# Patient Record
Sex: Female | Born: 1990 | Race: White | Hispanic: No | Marital: Married | Smoking: Never smoker
Health system: Southern US, Community
[De-identification: ages and names within clinical notes are randomized; demographics above are authoritative.]

## PROBLEM LIST (undated history)

## (undated) DIAGNOSIS — I10 Essential (primary) hypertension: Secondary | ICD-10-CM

## (undated) HISTORY — PX: OTHER SURGICAL HISTORY: SHX169

## (undated) HISTORY — PX: CHOLECYSTECTOMY: SHX55

---

## 2010-05-11 ENCOUNTER — Ambulatory Visit: Payer: Self-pay | Admitting: Family Medicine

## 2011-03-05 ENCOUNTER — Observation Stay: Payer: Self-pay | Admitting: Obstetrics and Gynecology

## 2011-03-11 ENCOUNTER — Ambulatory Visit: Payer: Self-pay | Admitting: Obstetrics & Gynecology

## 2011-03-16 ENCOUNTER — Observation Stay: Payer: Self-pay

## 2011-04-02 ENCOUNTER — Observation Stay: Payer: Self-pay | Admitting: Obstetrics & Gynecology

## 2011-04-20 ENCOUNTER — Observation Stay: Payer: Self-pay

## 2011-05-13 ENCOUNTER — Emergency Department: Payer: Self-pay | Admitting: Emergency Medicine

## 2011-06-04 ENCOUNTER — Observation Stay: Payer: Self-pay

## 2011-06-27 ENCOUNTER — Observation Stay: Payer: Self-pay | Admitting: Obstetrics and Gynecology

## 2011-06-29 ENCOUNTER — Inpatient Hospital Stay: Payer: Self-pay

## 2011-07-19 ENCOUNTER — Ambulatory Visit: Payer: Self-pay | Admitting: Surgery

## 2011-07-25 ENCOUNTER — Ambulatory Visit: Payer: Self-pay | Admitting: Surgery

## 2011-07-27 LAB — PATHOLOGY REPORT

## 2012-05-15 ENCOUNTER — Emergency Department: Payer: Self-pay | Admitting: Emergency Medicine

## 2012-05-15 LAB — URINALYSIS, COMPLETE
Bacteria: NONE SEEN
Bilirubin,UR: NEGATIVE
Glucose,UR: NEGATIVE mg/dL (ref 0–75)
Ketone: NEGATIVE
Leukocyte Esterase: NEGATIVE
Nitrite: NEGATIVE
Specific Gravity: 1.027 (ref 1.003–1.030)
Squamous Epithelial: 2
WBC UR: 3 /HPF (ref 0–5)

## 2012-05-15 LAB — CBC
HCT: 35.9 % (ref 35.0–47.0)
MCHC: 33.6 g/dL (ref 32.0–36.0)
MCV: 83 fL (ref 80–100)
RBC: 4.34 10*6/uL (ref 3.80–5.20)
RDW: 14.5 % (ref 11.5–14.5)

## 2012-05-15 LAB — COMPREHENSIVE METABOLIC PANEL
Albumin: 3.8 g/dL (ref 3.4–5.0)
Anion Gap: 7 (ref 7–16)
Calcium, Total: 8.9 mg/dL (ref 8.5–10.1)
Co2: 26 mmol/L (ref 21–32)
Creatinine: 0.66 mg/dL (ref 0.60–1.30)
EGFR (Non-African Amer.): >60
Glucose: 93 mg/dL (ref 65–99)
Osmolality: 277 (ref 275–301)
SGOT(AST): 22 U/L (ref 15–37)
SGPT (ALT): 19 U/L (ref 12–78)
Total Protein: 7.4 g/dL (ref 6.4–8.2)

## 2012-05-17 ENCOUNTER — Other Ambulatory Visit: Payer: Self-pay | Admitting: Emergency Medicine

## 2012-05-17 LAB — HCG, QUANTITATIVE, PREGNANCY: Beta Hcg, Quant.: 203 m[IU]/mL — ABNORMAL HIGH

## 2015-11-13 ENCOUNTER — Ambulatory Visit
Admission: EM | Admit: 2015-11-13 | Discharge: 2015-11-13 | Disposition: A | Discharge status: Home / Self Care | Payer: Managed Care, Other (non HMO) | Attending: Family Medicine | Admitting: Family Medicine

## 2015-11-13 DIAGNOSIS — K529 Noninfective gastroenteritis and colitis, unspecified: Secondary | ICD-10-CM | POA: Diagnosis not present

## 2015-11-13 LAB — COMPREHENSIVE METABOLIC PANEL
ALT: 17 U/L (ref 14–54)
AST: 19 U/L (ref 15–41)
Albumin: 4.1 g/dL (ref 3.5–5.0)
Alkaline Phosphatase: 75 U/L (ref 38–126)
Anion gap: 3 — ABNORMAL LOW (ref 5–15)
BUN: 10 mg/dL (ref 6–20)
CO2: 26 mmol/L (ref 22–32)
Calcium: 8.9 mg/dL (ref 8.9–10.3)
Chloride: 107 mmol/L (ref 101–111)
Creatinine, Ser: 0.66 mg/dL (ref 0.44–1.00)
GFR calc Af Amer: >60 mL/min (ref >60)
GFR calc non Af Amer: >60 mL/min (ref >60)
Glucose, Bld: 97 mg/dL (ref 65–99)
Potassium: 3.8 mmol/L (ref 3.5–5.1)
Sodium: 136 mmol/L (ref 135–145)
Total Bilirubin: 0.6 mg/dL (ref 0.3–1.2)
Total Protein: 7.4 g/dL (ref 6.5–8.1)

## 2015-11-13 LAB — CBC WITH DIFFERENTIAL/PLATELET
Basophils Absolute: 0 10*3/uL (ref 0–0.1)
Basophils Relative: 0 %
Eosinophils Absolute: 0.1 10*3/uL (ref 0–0.7)
Eosinophils Relative: 2 %
HCT: 34.3 % — ABNORMAL LOW (ref 35.0–47.0)
Hemoglobin: 11.2 g/dL — ABNORMAL LOW (ref 12.0–16.0)
Lymphocytes Relative: 33 %
Lymphs Abs: 2.1 10*3/uL (ref 1.0–3.6)
MCH: 25 pg — ABNORMAL LOW (ref 26.0–34.0)
MCHC: 32.5 g/dL (ref 32.0–36.0)
MCV: 76.8 fL — ABNORMAL LOW (ref 80.0–100.0)
Monocytes Absolute: 0.3 10*3/uL (ref 0.2–0.9)
Monocytes Relative: 5 %
Neutro Abs: 3.8 10*3/uL (ref 1.4–6.5)
Neutrophils Relative %: 60 %
Platelets: 193 10*3/uL (ref 150–440)
RBC: 4.47 MIL/uL (ref 3.80–5.20)
RDW: 15.4 % — ABNORMAL HIGH (ref 11.5–14.5)
WBC: 6.3 10*3/uL (ref 3.6–11.0)

## 2015-11-13 LAB — URINALYSIS COMPLETE WITH MICROSCOPIC (ARMC ONLY)
Glucose, UA: NEGATIVE mg/dL
Leukocytes, UA: NEGATIVE
Nitrite: NEGATIVE
Protein, ur: 30 mg/dL — AB
Specific Gravity, Urine: 1.03 (ref 1.005–1.030)
pH: 5.5 (ref 5.0–8.0)

## 2015-11-13 MED ORDER — ONDANSETRON 8 MG PO TBDP
8.0000 mg | ORAL_TABLET | Freq: Once | ORAL | Status: AC
  Administered 2015-11-13: 8 mg via ORAL

## 2015-11-13 MED ORDER — ONDANSETRON 8 MG PO TBDP: 8.0000 mg | ORAL_TABLET | Freq: Two times a day (BID) | ORAL | Status: DC

## 2015-11-13 NOTE — Discharge Instructions (Signed)

## 2015-11-13 NOTE — ED Notes (Signed)
Patient c/o vomiting, diarrhea, fever, body aches, nausea, chills, sweating, and inability to urinate since yesterday morning.

## 2015-11-13 NOTE — ED Provider Notes (Signed)
CSN: [id number]     Arrival date & time 11/13/15  1191 History   First MD Initiated Contact with Patient 11/13/15 1205     Chief Complaint  Patient presents with  . Emesis  . Diarrhea  . Fever  . Generalized Body Aches   (Consider location/radiation/quality/duration/timing/severity/associated sxs/prior Treatment) HPI   This is a 25 year old female presents with a three-day history of vomiting diarrhea and fever. She denies any belly pain. He states her fever was 103.8 last night. She is afebrile but has been taking some Tylenol according to her. She states that she has been unable to urinate since yesterday morning. She has very limited intake of food or fluids because of the vomiting that occurs immediately afterwards. She said 5-6 BMs per day and watery and have no blood or mucus content. She states that no one knows the family has been sick. She has not been eating outside of the home.  History reviewed. No pertinent past medical history. Past Surgical History  Procedure Laterality Date  . Cholecystectomy    . Tubes tied     Family History  Problem Relation Age of Onset  . Hypertension Father    Social History  Substance Use Topics  . Smoking status: Never Smoker   . Smokeless tobacco: Never Used  . Alcohol Use: No   OB History    No data available     Review of Systems  Constitutional: Positive for fever, chills, activity change and appetite change.  Gastrointestinal: Positive for nausea, vomiting and diarrhea. Negative for abdominal pain, blood in stool and abdominal distention.  Genitourinary: Positive for difficulty urinating.  All other systems reviewed and are negative.   Allergies  Latex  Home Medications   Prior to Admission medications   Medication Sig Start Date End Date Taking? Authorizing Provider  ondansetron (ZOFRAN ODT) 8 MG disintegrating tablet Take 1 tablet (8 mg total) by mouth 2 (two) times daily. 11/13/15   Lutricia Feil, PA-C   Meds  Ordered and Administered this Visit   Medications  ondansetron (ZOFRAN-ODT) disintegrating tablet 8 mg (8 mg Oral Given 11/13/15 1138)    BP 134/75 mmHg  Pulse 78  Temp(Src) 98.1 F (36.7 C) (Oral)  Resp 18  Ht  (1.651 m)  Wt 240 lb (108.863 kg)  BMI 39.94 kg/m2  SpO2 100%  LMP 11/03/2015 No data found.   Physical Exam  ED Course  Procedures (including critical care time)  Labs Review Labs Reviewed  CBC WITH DIFFERENTIAL/PLATELET - Abnormal; Notable for the following:    Hemoglobin 11.2 (*)    HCT 34.3 (*)    MCV 76.8 (*)    MCH 25.0 (*)    RDW 15.4 (*)    All other components within normal limits  URINALYSIS COMPLETEWITH MICROSCOPIC ([facility] ONLY) - Abnormal; Notable for the following:    APPearance CLOUDY (*)    Bilirubin Urine 1+ (*)    Ketones, ur TRACE (*)    Hgb urine dipstick 3+ (*)    Protein, ur 30 (*)    Bacteria, UA RARE (*)    Squamous Epithelial / LPF 0-5 (*)    All other components within normal limits  COMPREHENSIVE METABOLIC PANEL - Abnormal; Notable for the following:    Anion gap 3 (*)    All other components within normal limits  URINE CULTURE    Imaging Review No results found.   Visual Acuity Review  Right Eye Distance:   Left Eye Distance:  Bilateral Distance:    Right Eye Near:   Left Eye Near:    Bilateral Near:         MDM   1. Gastroenteritis, acute    There are no discharge medications for this patient.  Plan: 1. Test/x-ray results and diagnosis reviewed with patient 2. rx as per orders; risks, benefits, potential side effects reviewed with patient 3. Recommend supportive treatment with increase fluids. I told one important to take in fluids and to eat but when she begins to eat she should consider a brat diet and advance as tolerated. She needs to be seen by her primary for her increased bleeding from her menstrual cycle which has lasted 2 weeks now. This probably accounts for her increased  RBCs in her  urinalysis. 4. F/u prn if symptoms worsen or don't improve     Lutricia Feil, PA-C 11/13/15 1345

## 2015-11-15 LAB — URINE CULTURE: Culture: 50000

## 2017-05-30 ENCOUNTER — Encounter: Payer: Self-pay | Admitting: Emergency Medicine

## 2017-05-30 ENCOUNTER — Emergency Department
Admission: EM | Admit: 2017-05-30 | Discharge: 2017-05-30 | Disposition: A | Discharge status: Home / Self Care | Payer: Self-pay | Attending: Emergency Medicine | Admitting: Emergency Medicine

## 2017-05-30 ENCOUNTER — Emergency Department: Payer: Self-pay

## 2017-05-30 DIAGNOSIS — Y929 Unspecified place or not applicable: Secondary | ICD-10-CM | POA: Insufficient documentation

## 2017-05-30 DIAGNOSIS — Y999 Unspecified external cause status: Secondary | ICD-10-CM | POA: Insufficient documentation

## 2017-05-30 DIAGNOSIS — S60212A Contusion of left wrist, initial encounter: Secondary | ICD-10-CM

## 2017-05-30 DIAGNOSIS — W208XXA Other cause of strike by thrown, projected or falling object, initial encounter: Secondary | ICD-10-CM | POA: Insufficient documentation

## 2017-05-30 DIAGNOSIS — Y9389 Activity, other specified: Secondary | ICD-10-CM | POA: Insufficient documentation

## 2017-05-30 DIAGNOSIS — S63502A Unspecified sprain of left wrist, initial encounter: Secondary | ICD-10-CM

## 2017-05-30 NOTE — ED Notes (Signed)
Patient states she was working on a car Sunday and a window fell on her left wrist.  No obvious deformity.  Ice pack given to patient.

## 2017-05-30 NOTE — ED Provider Notes (Signed)
[facility] Emergency Department Provider Note ____________________________________________  Time seen: 1026  I have reviewed the triage vital signs and the nursing notes.  HISTORY  Chief Complaint  Wrist Pain  HPI Ann Huber is a 26 y.o. female presents to the ED for evaluation of pain to the dorsal left wrist and hand, NUMBER car on Sunday. Patient describes that she is working on the car door latch, when the window panel apparently fell trapping her hand in the window frame. She denies any cut laceration, or broken glass. She presents today with increasing pain grossly dorsal hand or wrist that is increased with extension of the hand. She denies anydistal paresthesias, wrist drop, or bruising. She's been applying ice and taking appropriate for pain relief. She gives a remote history (3 months) of a triquetrum chip fracture of the same wrist, recently cleared by [facility] ortho.   History reviewed. No pertinent past medical history.  There are no active problems to display for this patient.   Past Surgical History:  Procedure Laterality Date  . CHOLECYSTECTOMY    . Tubes Tied      Prior to Admission medications   Medication Sig Start Date End Date Taking? Authorizing Provider  ondansetron (ZOFRAN ODT) 8 MG disintegrating tablet Take 1 tablet (8 mg total) by mouth 2 (two) times daily. 11/13/15   Lutricia Feil, PA-C    Allergies Latex  Family History  Problem Relation Age of Onset  . Hypertension Father     Social History Social History  Substance Use Topics  . Smoking status: Never Smoker  . Smokeless tobacco: Never Used  . Alcohol use No    Review of Systems  Constitutional: Negative for fever. Musculoskeletal: Negative for back pain. Left wrist pain as above Skin: Negative for rash. Neurological: Negative for headaches, focal weakness or numbness. ____________________________________________  PHYSICAL EXAM:  VITAL SIGNS: ED  Triage Vitals  Enc Vitals Group     BP 05/30/17 0955 137/75     Pulse Rate 05/30/17 0955 88     Resp 05/30/17 0955 18     Temp 05/30/17 0955 98.8 F (37.1 C)     Temp Source 05/30/17 0955 Oral     SpO2 05/30/17 0955 99 %     Weight 05/30/17 0956 240 lb (108.9 kg)     Height 05/30/17 0956 5\' 5"  (1.651 m)     Head Circumference --      Peak Flow --      Pain Score 05/30/17 1149 3     Pain Loc --      Pain Edu? --      Excl. in GC? --     Constitutional: Alert and oriented. Well appearing and in no distress. Head: Normocephalic and atraumatic. Cardiovascular: Normal distal pulses. Respiratory: Normal respiratory effort. Musculoskeletal: Left wrist without obvious deformity, dislocation, or edema. Minimally tender to palp over the dorsal hand and radial wrist. Normal composite fist. Nontender with normal range of motion in all other extremities.  Neurologic:  Normal gross sensation. Normal intrinsic & opposition testing. Normal speech and language. No gross focal neurologic deficits are appreciated. Skin:  Skin is warm, dry and intact. No rash noted. ___________________________________________   RADIOLOGY  Left Wrist  IMPRESSION: Negative.  I, Rox Mcgriff, Charlesetta Ivory, personally viewed and evaluated these images (plain radiographs) as part of my medical decision making, as well as reviewing the written report by the radiologist. ____________________________________________  PROCEDURES  Wrist cock-up splint ____________________________________________  INITIAL  IMPRESSION / ASSESSMENT AND PLAN / ED COURSE  Patient ED evaluation of a left wrist contusion. No radiologic evidence of acute fracture or dislocation. The patient is placed in a wrist cock-up splint for support and comfort. She should dosed ibuprofen over-the-counter and consider follow-up at [facility] orthopedics for ongoing symptom management. ____________________________________________  FINAL CLINICAL IMPRESSION(S) /  ED DIAGNOSES  Final diagnoses:  Contusion of left wrist, initial encounter  Sprain of left wrist, initial encounter     Lissa Hoard, PA-C 05/30/17 1819    Jene Every, MD 06/02/17 [phone]

## 2017-05-30 NOTE — Discharge Instructions (Signed)
Your exam and x-ray are consistent with a wrist contusion and inflammation of the extensor tendons. Wear the splint for support. Take Naproxen daily. Apply ice to reduce pain and swelling. Follow-up with [facility] ortho for continued symptoms.

## 2017-05-30 NOTE — ED Triage Notes (Addendum)
states she was working on car  And the window came down on her left hand /wrist  States pain radiates from back of hand into wrist area

## 2017-08-14 ENCOUNTER — Encounter: Payer: Self-pay | Admitting: Intensive Care

## 2017-08-14 ENCOUNTER — Emergency Department
Admission: EM | Admit: 2017-08-14 | Discharge: 2017-08-14 | Disposition: A | Discharge status: Home / Self Care | Payer: Self-pay | Attending: Emergency Medicine | Admitting: Emergency Medicine

## 2017-08-14 ENCOUNTER — Emergency Department: Payer: Self-pay

## 2017-08-14 DIAGNOSIS — R739 Hyperglycemia, unspecified: Secondary | ICD-10-CM | POA: Insufficient documentation

## 2017-08-14 DIAGNOSIS — Z91040 Latex allergy status: Secondary | ICD-10-CM | POA: Insufficient documentation

## 2017-08-14 DIAGNOSIS — E875 Hyperkalemia: Secondary | ICD-10-CM | POA: Insufficient documentation

## 2017-08-14 DIAGNOSIS — I10 Essential (primary) hypertension: Secondary | ICD-10-CM | POA: Insufficient documentation

## 2017-08-14 DIAGNOSIS — R000 Tachycardia, unspecified: Secondary | ICD-10-CM | POA: Insufficient documentation

## 2017-08-14 DIAGNOSIS — R55 Syncope and collapse: Secondary | ICD-10-CM

## 2017-08-14 DIAGNOSIS — N3000 Acute cystitis without hematuria: Secondary | ICD-10-CM | POA: Insufficient documentation

## 2017-08-14 DIAGNOSIS — Z9049 Acquired absence of other specified parts of digestive tract: Secondary | ICD-10-CM | POA: Insufficient documentation

## 2017-08-14 LAB — URINALYSIS, COMPLETE (UACMP) WITH MICROSCOPIC
BILIRUBIN URINE: NEGATIVE
Glucose, UA: NEGATIVE mg/dL
HGB URINE DIPSTICK: NEGATIVE
KETONES UR: NEGATIVE mg/dL
Nitrite: POSITIVE — AB
Protein, ur: 100 mg/dL — AB
Specific Gravity, Urine: 1.027 (ref 1.005–1.030)
pH: 6 (ref 5.0–8.0)

## 2017-08-14 LAB — BASIC METABOLIC PANEL
Anion gap: 8 (ref 5–15)
BUN: 7 mg/dL (ref 6–20)
CALCIUM: 9.2 mg/dL (ref 8.9–10.3)
CO2: 22 mmol/L (ref 22–32)
CREATININE: 0.9 mg/dL (ref 0.44–1.00)
Chloride: 105 mmol/L (ref 101–111)
GFR calc non Af Amer: >60 mL/min (ref >60)
Glucose, Bld: 161 mg/dL — ABNORMAL HIGH (ref 65–99)
Potassium: 3.4 mmol/L — ABNORMAL LOW (ref 3.5–5.1)
SODIUM: 135 mmol/L (ref 135–145)

## 2017-08-14 LAB — CBC
HCT: 34.3 % — ABNORMAL LOW (ref 35.0–47.0)
Hemoglobin: 11.1 g/dL — ABNORMAL LOW (ref 12.0–16.0)
MCH: 24.5 pg — AB (ref 26.0–34.0)
MCHC: 32.5 g/dL (ref 32.0–36.0)
MCV: 75.5 fL — AB (ref 80.0–100.0)
PLATELETS: 218 10*3/uL (ref 150–440)
RBC: 4.54 MIL/uL (ref 3.80–5.20)
RDW: 15.5 % — ABNORMAL HIGH (ref 11.5–14.5)
WBC: 8.4 10*3/uL (ref 3.6–11.0)

## 2017-08-14 LAB — POCT PREGNANCY, URINE: PREG TEST UR: NEGATIVE

## 2017-08-14 LAB — TROPONIN I: Troponin I: <0.03 ng/mL (ref <0.03)

## 2017-08-14 MED ORDER — SODIUM CHLORIDE 0.9 % IV BOLUS (SEPSIS)
1000.0000 mL | Freq: Once | INTRAVENOUS | Status: AC
  Administered 2017-08-14: 1000 mL via INTRAVENOUS

## 2017-08-14 MED ORDER — SULFAMETHOXAZOLE-TRIMETHOPRIM 800-160 MG PO TABS: 1.0000 | ORAL_TABLET | Freq: Two times a day (BID) | ORAL | 0 refills | Status: DC

## 2017-08-14 MED ORDER — CEFTRIAXONE SODIUM IN DEXTROSE 20 MG/ML IV SOLN
1.0000 g | Freq: Once | INTRAVENOUS | Status: DC
  Filled 2017-08-14: qty 50

## 2017-08-14 MED ORDER — CEFTRIAXONE SODIUM 1 G IJ SOLR
1.0000 g | Freq: Once | INTRAMUSCULAR | Status: AC
  Administered 2017-08-14: 1 g via INTRAVENOUS
  Filled 2017-08-14: qty 10

## 2017-08-14 NOTE — ED Triage Notes (Signed)
Patient reports she was standing up at work started feeling lightheaded, shaking, and started seeing white space around her and she felt as if she was going to pass out. Did not have a LOC or fall to ground. No injury. Denies pain. Ambulatory in triage with no problems

## 2017-08-14 NOTE — ED Notes (Signed)
Pt denies pain, pt had a syncopal episode at work, pt reports " I feel better", pt denies any symptoms at time of assessment

## 2017-08-14 NOTE — ED Provider Notes (Signed)
[facility] Emergency Department Provider Note  ____________________________________________  Time seen: Approximately 4:46 PM  I have reviewed the triage vital signs and the nursing notes.   HISTORY  Chief Complaint Near Syncope    HPI Ann Huber is a 26 y.o. female, nonpregnant, with a history of recurrent UTI, presenting with presyncope.  The patient reports that she was at work today when she began to have a lightheaded sensation with mild palpitations.  She had no associated chest pain, shortness of breath, and did not have a syncopal event.  She has had dysuria with urinary frequency for the past 2 days.  No abdominal pain, nausea vomiting or diarrhea, fever or chills, change in vaginal discharge.  EMS was called with blood pressure 148/80 and a blood sugar of 177.  History reviewed. No pertinent past medical history.  There are no active problems to display for this patient.   Past Surgical History:  Procedure Laterality Date  . CHOLECYSTECTOMY    . Tubes Tied      Current Outpatient Rx  . Order #: [id number] Class: Normal  . Order #: [id number] Class: Print    Allergies Latex  Family History  Problem Relation Age of Onset  . Hypertension Father     Social History Social History   Tobacco Use  . Smoking status: Never Smoker  . Smokeless tobacco: Never Used  Substance Use Topics  . Alcohol use: No  . Drug use: No    Review of Systems Constitutional: No fever/chills.  P.  No diaphoresis. Eyes: No visual changes. ENT: No sore throat. No congestion or rhinorrhea. Cardiovascular: Denies chest pain. Denies palpitations. Respiratory: Denies shortness of breath.  No cough. Gastrointestinal: No abdominal pain.  No nausea, no vomiting.  No diarrhea.  No constipation. Genitourinary: Positive for dysuria.  Positive for urinary frequency. Musculoskeletal: Negative for back pain. Skin: Negative for rash. Neurological: Negative for  headaches. No focal numbness, tingling or weakness.     ____________________________________________   PHYSICAL EXAM:  VITAL SIGNS: ED Triage Vitals [08/14/17 1448]  Enc Vitals Group     BP (!) 155/96     Pulse Rate (!) 125     Resp 18     Temp 98.5 F (36.9 C)     Temp Source Oral     SpO2 99 %     Weight 240 lb (108.9 kg)     Height 5\' 5"  (1.651 m)     Head Circumference      Peak Flow      Pain Score      Pain Loc      Pain Edu?      Excl. in GC?     Constitutional: Alert and oriented. Well appearing and in no acute distress. Answers questions appropriately. Eyes: Conjunctivae are normal.  EOMI. No scleral icterus. Head: Atraumatic. Nose: No congestion/rhinnorhea. Mouth/Throat: Mucous membranes are dry.  Neck: No stridor.  Supple.  No JVD.  No meningismus. Cardiovascular: Fast rate, regular rhythm. No murmurs, rubs or gallops.  Respiratory: Normal respiratory effort.  No accessory muscle use or retractions. Lungs CTAB.  No wheezes, rales or ronchi. Gastrointestinal: Soft, nontender and nondistended.  No guarding or rebound.  No peritoneal signs. GU: Deferred as the patient has no vaginal complaints. Musculoskeletal: No LE edema. No ttp in the calves or palpable cords.  Negative Homan's sign. Neurologic:  A&Ox3.  Speech is clear.  Face and smile are symmetric.  EOMI.  Moves all extremities well. Skin:  Skin is warm, dry and intact. No rash noted. Psychiatric: Mood and affect are normal. Speech and behavior are normal.  Normal judgement.  ____________________________________________   LABS (all labs ordered are listed, but only abnormal results are displayed)  Labs Reviewed  BASIC METABOLIC PANEL - Abnormal; Notable for the following components:      Result Value   Potassium 3.4 (*)    Glucose, Bld 161 (*)    All other components within normal limits  CBC - Abnormal; Notable for the following components:   Hemoglobin 11.1 (*)    HCT 34.3 (*)    MCV 75.5 (*)     MCH 24.5 (*)    RDW 15.5 (*)    All other components within normal limits  URINALYSIS, COMPLETE (UACMP) WITH MICROSCOPIC - Abnormal; Notable for the following components:   Color, Urine AMBER (*)    APPearance CLEAR (*)    Protein, ur 100 (*)    Nitrite POSITIVE (*)    Leukocytes, UA TRACE (*)    Bacteria, UA RARE (*)    Squamous Epithelial / LPF 0-5 (*)    All other components within normal limits  URINE CULTURE  TROPONIN I  POCT PREGNANCY, URINE  POC URINE PREG, ED   ____________________________________________  EKG  ED ECG REPORT I, Rockne MenghiniNorman, Anne-Caroline, the attending physician, personally viewed and interpreted this ECG.   Date: 08/14/2017  EKG Time: 1454  Rate: 123  Rhythm: sinus tachycardia  Axis: normal  Intervals:none  ST&T Change: No STEMI  ____________________________________________  RADIOLOGY  Dg Chest 2 View  Result Date: 08/14/2017 CLINICAL DATA:  Syncope. EXAM: CHEST  2 VIEW COMPARISON:  None. FINDINGS: The heart size and mediastinal contours are within normal limits. Both lungs are clear. The visualized skeletal structures are unremarkable. IMPRESSION: No active cardiopulmonary disease. Electronically Signed   By: Signa Kellaylor  Stroud M.D.   On: 08/14/2017 15:33    ____________________________________________   PROCEDURES  Procedure(s) performed: None  Procedures  Critical Care performed: No ____________________________________________   INITIAL IMPRESSION / ASSESSMENT AND PLAN / ED COURSE  Pertinent labs & imaging results that were available during my care of the patient were reviewed by me and considered in my medical decision making (see chart for details).  26 y.o. email, nonpregnant, presenting with dysuria, urinary frequency and a presyncopal episode today.  Overall, the patient is tachycardic, and may be slightly hypovolemic given her dry mucous membranes.  He does have a urinary tract infection, but a reassuring creatinine and a normal  white blood cell count.  I will plan to treat her with intravenous fluids and Rocephin, and her urine has been sent for culture.  Her presyncope workup is likely due to hypovolemia or infection, rather than an acute cardiac or pulmonary cause.  Her EKG does not show ischemic changes, her chest x-ray shows no active cardiopulmonary disease, and her physical examination is reassuring.  Plan reevaluation after fluids and antibiotics, with anticipation for discharge home.  ----------------------------------------- 6:43 PM on 08/14/2017 -----------------------------------------  At this time, the patient's tachycardia has resolved, and she is able to tolerate liquid without any difficulty.  She is able to stand without becoming lightheaded.  She has received a dose of intravenous Rocephin, and will start Bactrim tomorrow for 7-day course.  She understands return precautions as well as follow-up instructions. ____________________________________________  FINAL CLINICAL IMPRESSION(S) / ED DIAGNOSES  Final diagnoses:  Acute cystitis without hematuria  Pre-syncope  Sinus tachycardia  Hyperkalemia  Hyperglycemia  Hypertension, unspecified type  NEW MEDICATIONS STARTED DURING THIS VISIT:  This SmartLink is deprecated. Use AVSMEDLIST instead to display the medication list for a patient.    Rockne Menghini, MD 08/14/17 [phone]

## 2017-08-14 NOTE — Discharge Instructions (Signed)
Please drink plenty of fluids to stay well-hydrated and to prevent lightheadedness.  Take the entire course of antibiotics, even if you are feeling better.  Please make an appointment with your primary care doctor to have your blood pressure rechecked, your blood sugar and your potassium rechecked, and to be reevaluated for your symptoms.  Return to the emergency department if you develop severe pain, lightheadedness or fainting, fever, or any other symptoms concerning to you.

## 2017-08-16 LAB — URINE CULTURE

## 2017-09-01 ENCOUNTER — Emergency Department
Admission: EM | Admit: 2017-09-01 | Discharge: 2017-09-01 | Disposition: A | Discharge status: Home / Self Care | Payer: Self-pay | Attending: Emergency Medicine | Admitting: Emergency Medicine

## 2017-09-01 ENCOUNTER — Other Ambulatory Visit: Payer: Self-pay

## 2017-09-01 DIAGNOSIS — R112 Nausea with vomiting, unspecified: Secondary | ICD-10-CM | POA: Insufficient documentation

## 2017-09-01 DIAGNOSIS — R1031 Right lower quadrant pain: Secondary | ICD-10-CM | POA: Insufficient documentation

## 2017-09-01 DIAGNOSIS — R1110 Vomiting, unspecified: Secondary | ICD-10-CM

## 2017-09-01 DIAGNOSIS — Z79899 Other long term (current) drug therapy: Secondary | ICD-10-CM | POA: Insufficient documentation

## 2017-09-01 DIAGNOSIS — R109 Unspecified abdominal pain: Secondary | ICD-10-CM

## 2017-09-01 DIAGNOSIS — Z91040 Latex allergy status: Secondary | ICD-10-CM | POA: Insufficient documentation

## 2017-09-01 DIAGNOSIS — R197 Diarrhea, unspecified: Secondary | ICD-10-CM | POA: Insufficient documentation

## 2017-09-01 LAB — CBC
HEMATOCRIT: 31.3 % — AB (ref 35.0–47.0)
HEMOGLOBIN: 10.1 g/dL — AB (ref 12.0–16.0)
MCH: 24.1 pg — AB (ref 26.0–34.0)
MCHC: 32.2 g/dL (ref 32.0–36.0)
MCV: 74.8 fL — ABNORMAL LOW (ref 80.0–100.0)
Platelets: 217 10*3/uL (ref 150–440)
RBC: 4.18 MIL/uL (ref 3.80–5.20)
RDW: 15.3 % — ABNORMAL HIGH (ref 11.5–14.5)
WBC: 6 10*3/uL (ref 3.6–11.0)

## 2017-09-01 LAB — URINALYSIS, COMPLETE (UACMP) WITH MICROSCOPIC
BACTERIA UA: NONE SEEN
BILIRUBIN URINE: NEGATIVE
GLUCOSE, UA: NEGATIVE mg/dL
Hgb urine dipstick: NEGATIVE
KETONES UR: NEGATIVE mg/dL
NITRITE: NEGATIVE
PROTEIN: 30 mg/dL — AB
Specific Gravity, Urine: 1.027 (ref 1.005–1.030)
pH: 5 (ref 5.0–8.0)

## 2017-09-01 LAB — COMPREHENSIVE METABOLIC PANEL
ALBUMIN: 3.8 g/dL (ref 3.5–5.0)
ALT: 14 U/L (ref 14–54)
ANION GAP: 7 (ref 5–15)
AST: 16 U/L (ref 15–41)
Alkaline Phosphatase: 71 U/L (ref 38–126)
BUN: 10 mg/dL (ref 6–20)
CO2: 25 mmol/L (ref 22–32)
Calcium: 8.9 mg/dL (ref 8.9–10.3)
Chloride: 106 mmol/L (ref 101–111)
Creatinine, Ser: 0.76 mg/dL (ref 0.44–1.00)
GFR calc Af Amer: >60 mL/min (ref >60)
GFR calc non Af Amer: >60 mL/min (ref >60)
GLUCOSE: 106 mg/dL — AB (ref 65–99)
POTASSIUM: 3.1 mmol/L — AB (ref 3.5–5.1)
SODIUM: 138 mmol/L (ref 135–145)
TOTAL PROTEIN: 6.8 g/dL (ref 6.5–8.1)
Total Bilirubin: 0.6 mg/dL (ref 0.3–1.2)

## 2017-09-01 LAB — LIPASE, BLOOD: LIPASE: 28 U/L (ref 11–51)

## 2017-09-01 LAB — POCT PREGNANCY, URINE: Preg Test, Ur: NEGATIVE

## 2017-09-01 MED ORDER — POTASSIUM CHLORIDE CRYS ER 20 MEQ PO TBCR
40.0000 meq | EXTENDED_RELEASE_TABLET | Freq: Once | ORAL | Status: AC
Start: 2017-09-01 — End: 2017-09-01
  Administered 2017-09-01: 40 meq via ORAL
  Filled 2017-09-01: qty 2

## 2017-09-01 MED ORDER — ONDANSETRON HCL 4 MG/2ML IJ SOLN
4.0000 mg | Freq: Once | INTRAMUSCULAR | Status: AC | PRN
  Administered 2017-09-01: 4 mg via INTRAVENOUS
  Filled 2017-09-01: qty 2

## 2017-09-01 MED ORDER — ONDANSETRON HCL 4 MG/2ML IJ SOLN
4.0000 mg | Freq: Once | INTRAMUSCULAR | Status: AC
  Administered 2017-09-01: 4 mg via INTRAVENOUS
  Filled 2017-09-01: qty 2

## 2017-09-01 MED ORDER — MORPHINE SULFATE (PF) 4 MG/ML IV SOLN
4.0000 mg | Freq: Once | INTRAVENOUS | Status: AC
  Administered 2017-09-01: 4 mg via INTRAVENOUS
  Filled 2017-09-01: qty 1

## 2017-09-01 MED ORDER — SODIUM CHLORIDE 0.9 % IV BOLUS (SEPSIS)
1000.0000 mL | Freq: Once | INTRAVENOUS | Status: AC
  Administered 2017-09-01: 1000 mL via INTRAVENOUS

## 2017-09-01 NOTE — ED Triage Notes (Signed)
Pt c/o RLQ pain with N/V/D since Tuesday. Pt is ambulatory, in NAD on arrival.

## 2017-09-01 NOTE — Discharge Instructions (Signed)
You are evaluated for nausea vomiting and diarrhea as well as right-sided abdominal pain, and although no certain cause was found, your exam and evaluation are overall reassuring in the emergency department today.  Return to the emergency department for any worsening or uncontrolled pain, fever, black or bloody stool, vomiting blood, vaginal discharge, pelvic pain, or any other symptoms concerning to you.  You may try over the counter ibuprofen and/or tylenol, use as directed on labeling.

## 2017-09-01 NOTE — ED Provider Notes (Signed)
[facility] Emergency Department Provider Note ____________________________________________   I have reviewed the triage vital signs and the triage nursing note.  HISTORY  Chief Complaint Abdominal Pain   Historian Patient  HPI Ann Huber is a 26 y.o. female with a history of cholecystectomy as well as BTL presenting for several days of abdominal cramping especially on the right side, at this point seems fairly constant for the last 2 or 3 days of right-sided mid abdomen down into the right lower quadrant.  She had nausea vomiting and diarrhea.  No black or bloody stools.  No fever.  No bloody emesis.  Never had pain like this before.  Pain is currently around 7 or 8 out of 10.  Nothing seems to make it worse or better.  No coughing or trouble breathing.  Denies unusual vaginal bleeding, last menstrual period was last week.  No vaginal discharge.  No pelvic pain.   History reviewed. No pertinent past medical history.  There are no active problems to display for this patient.   Past Surgical History:  Procedure Laterality Date  . CHOLECYSTECTOMY    . Tubes Tied      Prior to Admission medications   Medication Sig Start Date End Date Taking? Authorizing Provider  buPROPion (WELLBUTRIN SR) 150 MG 12 hr tablet Take 150 mg by mouth daily. 11/24/16 11/24/17 Yes [provider]  sertraline (ZOLOFT) 100 MG tablet Take 100 mg by mouth daily. 11/24/16  Yes [provider]  ondansetron (ZOFRAN ODT) 8 MG disintegrating tablet Take 1 tablet (8 mg total) by mouth 2 (two) times daily. Patient not taking: Reported on 09/01/2017 11/13/15   Ovid Curdoemer, William P, PA-C  sulfamethoxazole-trimethoprim (BACTRIM DS,SEPTRA DS) 800-160 MG tablet Take 1 tablet 2 (two) times daily by mouth. Patient not taking: Reported on 09/01/2017 08/14/17   Rockne MenghiniNorman, Anne-Caroline, MD    Allergies  Allergen Reactions  . Latex Hives and Swelling    Family History   Problem Relation Age of Onset  . Hypertension Father     Social History Social History   Tobacco Use  . Smoking status: Never Smoker  . Smokeless tobacco: Never Used  Substance Use Topics  . Alcohol use: No  . Drug use: No    Review of Systems  Constitutional: Negative for fever. Eyes: Negative for visual changes. ENT: Negative for sore throat. Cardiovascular: Negative for chest pain. Respiratory: Negative for shortness of breath. Gastrointestinal: Positive as per HPI Genitourinary: Negative for dysuria. Musculoskeletal: Negative for back pain. Skin: Negative for rash. Neurological: Negative for headache.  ____________________________________________   PHYSICAL EXAM:  VITAL SIGNS: ED Triage Vitals  Enc Vitals Group     BP 09/01/17 0727 133/80     Pulse Rate 09/01/17 0727 85     Resp 09/01/17 0727 17     Temp 09/01/17 0727 98.2 F (36.8 C)     Temp Source 09/01/17 0727 Oral     SpO2 09/01/17 0727 100 %     Weight 09/01/17 0727 240 lb (108.9 kg)     Height 09/01/17 0727 5\' 5"  (1.651 m)     Head Circumference --      Peak Flow --      Pain Score 09/01/17 0726 8     Pain Loc --      Pain Edu? --      Excl. in GC? --      Constitutional: Alert and oriented. Well appearing and in no distress. HEENT   Head:  Normocephalic and atraumatic.      Eyes: Conjunctivae are normal. Pupils equal and round.       Ears:         Nose: No congestion/rhinnorhea.   Mouth/Throat: Mucous membranes are moist.   Neck: No stridor. Cardiovascular/Chest: Normal rate, regular rhythm.  No murmurs, rubs, or gallops. Respiratory: Normal respiratory effort without tachypnea nor retractions. Breath sounds are clear and equal bilaterally. No wheezes/rales/rhonchi. Gastrointestinal: Soft. No distention, no guarding, no rebound.  Moderate right-sided tenderness to palpation without any masses, more so in the right mid abdomen but also somewhat in the right lower quadrant.  Mild  epigastric discomfort.  No left-sided pain or discomfort in the left upper or left lower quadrants.  Obese Genitourinary/rectal:Deferred Musculoskeletal: Nontender with normal range of motion in all extremities. No joint effusions.  No lower extremity tenderness.  No edema. Neurologic:  Normal speech and language. No gross or focal neurologic deficits are appreciated. Skin:  Skin is warm, dry and intact. No rash noted. Psychiatric: Mood and affect are normal. Speech and behavior are normal. Patient exhibits appropriate insight and judgment.   ____________________________________________  LABS (pertinent positives/negatives) I, Governor Rooks, MD the attending physician have reviewed the labs noted below.  Labs Reviewed  COMPREHENSIVE METABOLIC PANEL - Abnormal; Notable for the following components:      Result Value   Potassium 3.1 (*)    Glucose, Bld 106 (*)    All other components within normal limits  CBC - Abnormal; Notable for the following components:   Hemoglobin 10.1 (*)    HCT 31.3 (*)    MCV 74.8 (*)    MCH 24.1 (*)    RDW 15.3 (*)    All other components within normal limits  URINALYSIS, COMPLETE (UACMP) WITH MICROSCOPIC - Abnormal; Notable for the following components:   Color, Urine YELLOW (*)    APPearance HAZY (*)    Protein, ur 30 (*)    Leukocytes, UA MODERATE (*)    Squamous Epithelial / LPF 6-30 (*)    All other components within normal limits  LIPASE, BLOOD  POCT PREGNANCY, URINE    ____________________________________________    EKG I, Governor Rooks, MD, the attending physician have personally viewed and interpreted all ECGs.  None ____________________________________________  RADIOLOGY All Xrays were viewed by me.  Imaging interpreted by Radiologist, and I, Governor Rooks, MD the attending physician have reviewed the radiologist interpretation noted below.  None __________________________________________  PROCEDURES  Procedure(s) performed:  None  Critical Care performed: None   ____________________________________________  ED COURSE / ASSESSMENT AND PLAN  Pertinent labs & imaging results that were available during my care of the patient were reviewed by me and considered in my medical decision making (see chart for details).   Given the significant amount of vomiting and diarrhea over the past several days, seems likely symptoms are due to intestinal/gastroenteritis, however the more right-sided pain raises suspicion potentially for appendicitis.  She is not having any urinary symptoms and her urine is clear without signs of infection or blood for stone.  She is denying pelvic discomfort or discharge and has declined pelvic exam today.  I think this is okay and reasonable, though I did let her know routinely I would go ahead and do a pelvic exam for abdominal complaint.  She was treated symptomatically with IV fluids as well as nausea and pain medication.  Laboratory studies are overall reassuring with no elevated white blood cell count.  She does have a slightly  low potassium was given repletion.  Reexamination around 1225, patient states she feels better, although she feels like the pain has moved a little bit more upper.  We discussed that right now I have low suspicion for appendicitis, patient agrees that she does not want to do a CT scan as she had one a couple of days ago for unrelated reasons.  I think her pain symptoms are probably coming from intestinal cramping from her symptoms of vomiting and diarrhea.  Patient was controlled going home I think this is reasonable right now.   DIFFERENTIAL DIAGNOSIS: Differential diagnosis includes, but is not limited to, ovarian cyst, ovarian torsion, acute appendicitis, diverticulitis, urinary tract infection/pyelonephritis, endometriosis, bowel obstruction, colitis, renal colic, gastroenteritis, hernia, fibroids, endometriosis, pregnancy related pain including ectopic pregnancy,  etc.   CONSULTATIONS:  None  Patient / Family / Caregiver informed of clinical course, medical decision-making process, and agree with plan.   I discussed return precautions, follow-up instructions, and discharge instructions with patient and/or family.  Discharge Instructions : You are evaluated for nausea vomiting and diarrhea as well as right-sided abdominal pain, and although no certain cause was found, your exam and evaluation are overall reassuring in the emergency department today.  Return to the emergency department for any worsening or uncontrolled pain, fever, black or bloody stool, vomiting blood, vaginal discharge, pelvic pain, or any other symptoms concerning to you.  You may try over the counter ibuprofen and/or tylenol, use as directed on labeling.     ___________________________________________   FINAL CLINICAL IMPRESSION(S) / ED DIAGNOSES   Final diagnoses:  Vomiting and diarrhea  Right lateral abdominal pain      ___________________________________________        Note: This dictation was prepared with Dragon dictation. Any transcriptional errors that result from this process are unintentional    Governor RooksLord, Naureen Benton, MD 09/01/17 1228

## 2017-09-01 NOTE — ED Notes (Signed)
First Nurse Note:  Patient presents to the ED with right lower quadrant pain since Tuesday and nausea and vomiting.

## 2018-08-23 ENCOUNTER — Encounter: Payer: Self-pay | Admitting: Emergency Medicine

## 2018-08-23 ENCOUNTER — Emergency Department
Admission: EM | Admit: 2018-08-23 | Discharge: 2018-08-23 | Disposition: A | Discharge status: Home / Self Care | Payer: Self-pay | Attending: Emergency Medicine | Admitting: Emergency Medicine

## 2018-08-23 DIAGNOSIS — Z79899 Other long term (current) drug therapy: Secondary | ICD-10-CM | POA: Insufficient documentation

## 2018-08-23 DIAGNOSIS — D508 Other iron deficiency anemias: Secondary | ICD-10-CM | POA: Insufficient documentation

## 2018-08-23 DIAGNOSIS — R1310 Dysphagia, unspecified: Secondary | ICD-10-CM | POA: Insufficient documentation

## 2018-08-23 DIAGNOSIS — E876 Hypokalemia: Secondary | ICD-10-CM | POA: Insufficient documentation

## 2018-08-23 DIAGNOSIS — L03211 Cellulitis of face: Secondary | ICD-10-CM | POA: Insufficient documentation

## 2018-08-23 DIAGNOSIS — R220 Localized swelling, mass and lump, head: Secondary | ICD-10-CM | POA: Insufficient documentation

## 2018-08-23 DIAGNOSIS — Z91040 Latex allergy status: Secondary | ICD-10-CM | POA: Insufficient documentation

## 2018-08-23 LAB — CBC WITH DIFFERENTIAL/PLATELET
Abs Immature Granulocytes: 0.01 10*3/uL (ref 0.00–0.07)
Basophils Absolute: 0 10*3/uL (ref 0.0–0.1)
Basophils Relative: 0 %
EOS ABS: 0.1 10*3/uL (ref 0.0–0.5)
EOS PCT: 2 %
HCT: 30.5 % — ABNORMAL LOW (ref 36.0–46.0)
Hemoglobin: 9.5 g/dL — ABNORMAL LOW (ref 12.0–15.0)
Immature Granulocytes: 0 %
Lymphocytes Relative: 30 %
Lymphs Abs: 1.7 10*3/uL (ref 0.7–4.0)
MCH: 24 pg — AB (ref 26.0–34.0)
MCHC: 31.1 g/dL (ref 30.0–36.0)
MCV: 77 fL — AB (ref 80.0–100.0)
MONO ABS: 0.3 10*3/uL (ref 0.1–1.0)
MONOS PCT: 6 %
Neutro Abs: 3.4 10*3/uL (ref 1.7–7.7)
Neutrophils Relative %: 62 %
PLATELETS: 228 10*3/uL (ref 150–400)
RBC: 3.96 MIL/uL (ref 3.87–5.11)
RDW: 15 % (ref 11.5–15.5)
WBC: 5.5 10*3/uL (ref 4.0–10.5)
nRBC: 0 % (ref 0.0–0.2)

## 2018-08-23 LAB — BASIC METABOLIC PANEL
ANION GAP: 5 (ref 5–15)
BUN: 8 mg/dL (ref 6–20)
CALCIUM: 8.6 mg/dL — AB (ref 8.9–10.3)
CO2: 27 mmol/L (ref 22–32)
Chloride: 107 mmol/L (ref 98–111)
Creatinine, Ser: 0.6 mg/dL (ref 0.44–1.00)
GFR calc Af Amer: >60 mL/min (ref >60)
GLUCOSE: 99 mg/dL (ref 70–99)
Potassium: 3.2 mmol/L — ABNORMAL LOW (ref 3.5–5.1)
Sodium: 139 mmol/L (ref 135–145)

## 2018-08-23 MED ORDER — CLINDAMYCIN HCL 300 MG PO CAPS: 300.0000 mg | ORAL_CAPSULE | Freq: Three times a day (TID) | ORAL | 0 refills | Status: AC

## 2018-08-23 MED ORDER — CLINDAMYCIN PHOSPHATE 600 MG/50ML IV SOLN
600.0000 mg | Freq: Once | INTRAVENOUS | Status: AC
  Administered 2018-08-23: 600 mg via INTRAVENOUS
  Filled 2018-08-23: qty 50

## 2018-08-23 NOTE — ED Triage Notes (Signed)
Patient complaining facial swelling starting this AM.  Seen by PCP last Friday for eye swelling and was placed on eye drops - unsure of name - "it was for pink eye".  Denies itching.  Patient states it hurts to swallow especially on left side. Denies fevers.

## 2018-08-23 NOTE — ED Notes (Signed)
Patient says she has had some facial swelling since Friday starting with her eye.  In nad. Has been seen by provider already.

## 2018-08-23 NOTE — Discharge Instructions (Addendum)
Take medication as directed and follow-up with primary care for your chronic medical conditions showing chronic anemia condition and low potassium.

## 2018-08-23 NOTE — ED Provider Notes (Addendum)
[facility] Emergency Department Provider Note   ____________________________________________   First MD Initiated Contact with Patient 08/23/18 1030     (approximate)  I have reviewed the triage vital signs and the nursing notes.   HISTORY  Chief Complaint Facial Swelling    HPI Ann Huber is a 27 y.o. female patient presents with left facial edema upon a.m. awakening.  She states she is currently being treated for bacterial conjunctivitis of the left eye.  Patient started eyedrops 2 days ago.  Patient states she no longer noticed the redness or matted eyelids.  Patient denies fever/chills associated this complaint.  Patient denies nausea, vomiting, diarrhea.  Patient has  mild dysphagia.,  But can tolerate food and fluids.  History reviewed. No pertinent past medical history.  There are no active problems to display for this patient.   Past Surgical History:  Procedure Laterality Date  . CHOLECYSTECTOMY    . Tubes Tied      Prior to Admission medications   Medication Sig Start Date End Date Taking? Authorizing Provider  buPROPion (WELLBUTRIN SR) 150 MG 12 hr tablet Take 150 mg by mouth daily. 11/24/16 11/24/17  [provider]  clindamycin (CLEOCIN) 300 MG capsule Take 1 capsule (300 mg total) by mouth 3 (three) times daily for 10 days. 08/23/18 09/02/18  Joni Reining, PA-C  ondansetron (ZOFRAN ODT) 8 MG disintegrating tablet Take 1 tablet (8 mg total) by mouth 2 (two) times daily. Patient not taking: Reported on 09/01/2017 11/13/15   Lutricia Feil, PA-C  sertraline (ZOLOFT) 100 MG tablet Take 100 mg by mouth daily. 11/24/16   [provider]  sulfamethoxazole-trimethoprim (BACTRIM DS,SEPTRA DS) 800-160 MG tablet Take 1 tablet 2 (two) times daily by mouth. Patient not taking: Reported on 09/01/2017 08/14/17   Rockne Menghini, MD    Allergies Latex  Family History  Problem Relation Age of Onset  .  Hypertension Father     Social History Social History   Tobacco Use  . Smoking status: Never Smoker  . Smokeless tobacco: Never Used  Substance Use Topics  . Alcohol use: No  . Drug use: No    Review of Systems  Constitutional: No fever/chills Eyes: No visual changes. ENT: No sore throat. Cardiovascular: Denies chest pain. Respiratory: Denies shortness of breath. Gastrointestinal: No abdominal pain.  No nausea, no vomiting.  No diarrhea.  No constipation. Genitourinary: Negative for dysuria. Musculoskeletal: Negative for back pain. Skin: Negative for rash. Neurological: Negative for headaches, focal weakness or numbness. Endocrine:Hypertension Hematological/Lymphatic:Anemic Allergic/Immunilogical: Latex  ____________________________________________   PHYSICAL EXAM:  VITAL SIGNS: ED Triage Vitals  Enc Vitals Group     BP --      Pulse Rate 08/23/18 1025 85     Resp 08/23/18 1025 15     Temp 08/23/18 1025 98.5 F (36.9 C)     Temp Source 08/23/18 1025 Oral     SpO2 08/23/18 1025 98 %     Weight 08/23/18 1022 230 lb (104.3 kg)     Height 08/23/18 1022 5\' 5"  (1.651 m)     Head Circumference --      Peak Flow --      Pain Score 08/23/18 1022 0     Pain Loc --      Pain Edu? --      Excl. in GC? --     Constitutional: Alert and oriented. Well appearing and in no acute distress.  Obesity. Eyes: Conjunctivae are normal. PERRL.  EOMI. Mouth/Throat: Mucous membranes are moist.  Oropharynx non-erythematous. Neck: No stridor. Hematological/Lymphatic/Immunilogical: No cervical lymphadenopathy. Cardiovascular: Normal rate, regular rhythm. Grossly normal heart sounds.  Good peripheral circulation. Respiratory: Normal respiratory effort.  No retractions. Lungs CTAB. Neurologic:  Normal speech and language. No gross focal neurologic deficits are appreciated.  Skin: Left facial edema and erythema.  Psychiatric: Mood and affect are normal. Speech and behavior are  normal.  ____________________________________________   LABS (all labs ordered are listed, but only abnormal results are displayed)  Labs Reviewed  BASIC METABOLIC PANEL - Abnormal; Notable for the following components:      Result Value   Potassium 3.2 (*)    Calcium 8.6 (*)    All other components within normal limits  CBC WITH DIFFERENTIAL/PLATELET - Abnormal; Notable for the following components:   Hemoglobin 9.5 (*)    HCT 30.5 (*)    MCV 77.0 (*)    MCH 24.0 (*)    All other components within normal limits   ____________________________________________  EKG   ____________________________________________  RADIOLOGY  ED MD interpretation:    Official radiology report(s): No results found.  ____________________________________________   PROCEDURES  Procedure(s) performed: None  Procedures  Critical Care performed: No  ____________________________________________   INITIAL IMPRESSION / ASSESSMENT AND PLAN / ED COURSE  As part of my medical decision making, I reviewed the following data within the electronic MEDICAL RECORD NUMBER    Left facial edema and erythema secondary cellulitis.  Advised patient of lab findings showing that she continues to be anemic.  Patient given IV clindamycin and will switch to oral medication.  Patient advised to follow-up with [facility] primary care for reevaluation of her anemic and hypokalemia condition.  Patient given a work note and advised to return my ED if her condition worsen before follow-up with her PCP.      ____________________________________________   FINAL CLINICAL IMPRESSION(S) / ED DIAGNOSES  Final diagnoses:  Left facial swelling  Facial cellulitis  Chronic hypokalemia  Other iron deficiency anemia     ED Discharge Orders         Ordered    clindamycin (CLEOCIN) 300 MG capsule  3 times daily     08/23/18 1109           Note:  This document was prepared using Dragon voice recognition software and  may include unintentional dictation errors.    Joni ReiningSmith, Bristol Osentoski K, PA-C 08/23/18 1122    Joni ReiningSmith, Orlondo Holycross K, PA-C 08/23/18 1127    Governor RooksLord, Rebecca, MD 08/23/18 [phone]

## 2019-03-11 ENCOUNTER — Emergency Department
Admission: EM | Admit: 2019-03-11 | Discharge: 2019-03-11 | Disposition: A | Discharge status: Home / Self Care | Payer: PRIVATE HEALTH INSURANCE | Attending: Emergency Medicine | Admitting: Emergency Medicine

## 2019-03-11 ENCOUNTER — Emergency Department: Payer: PRIVATE HEALTH INSURANCE

## 2019-03-11 ENCOUNTER — Other Ambulatory Visit: Payer: Self-pay

## 2019-03-11 DIAGNOSIS — Z91040 Latex allergy status: Secondary | ICD-10-CM | POA: Insufficient documentation

## 2019-03-11 DIAGNOSIS — N83201 Unspecified ovarian cyst, right side: Secondary | ICD-10-CM | POA: Insufficient documentation

## 2019-03-11 DIAGNOSIS — R109 Unspecified abdominal pain: Secondary | ICD-10-CM | POA: Diagnosis present

## 2019-03-11 DIAGNOSIS — Z79899 Other long term (current) drug therapy: Secondary | ICD-10-CM | POA: Insufficient documentation

## 2019-03-11 LAB — URINALYSIS, COMPLETE (UACMP) WITH MICROSCOPIC
Bacteria, UA: NONE SEEN
Bilirubin Urine: NEGATIVE
Glucose, UA: NEGATIVE mg/dL
Hgb urine dipstick: NEGATIVE
Ketones, ur: NEGATIVE mg/dL
Leukocytes,Ua: NEGATIVE
Nitrite: NEGATIVE
Protein, ur: NEGATIVE mg/dL
Specific Gravity, Urine: 1.021 (ref 1.005–1.030)
pH: 5 (ref 5.0–8.0)

## 2019-03-11 LAB — COMPREHENSIVE METABOLIC PANEL
ALT: 24 U/L (ref 0–44)
AST: 24 U/L (ref 15–41)
Albumin: 4.1 g/dL (ref 3.5–5.0)
Alkaline Phosphatase: 64 U/L (ref 38–126)
Anion gap: 4 — ABNORMAL LOW (ref 5–15)
BUN: 13 mg/dL (ref 6–20)
CO2: 28 mmol/L (ref 22–32)
Calcium: 8.8 mg/dL — ABNORMAL LOW (ref 8.9–10.3)
Chloride: 106 mmol/L (ref 98–111)
Creatinine, Ser: 0.57 mg/dL (ref 0.44–1.00)
GFR calc Af Amer: >60 mL/min (ref >60)
GFR calc non Af Amer: >60 mL/min (ref >60)
Glucose, Bld: 113 mg/dL — ABNORMAL HIGH (ref 70–99)
Potassium: 3.6 mmol/L (ref 3.5–5.1)
Sodium: 138 mmol/L (ref 135–145)
Total Bilirubin: 0.5 mg/dL (ref 0.3–1.2)
Total Protein: 6.9 g/dL (ref 6.5–8.1)

## 2019-03-11 LAB — CBC
HCT: 34.4 % — ABNORMAL LOW (ref 36.0–46.0)
Hemoglobin: 10.5 g/dL — ABNORMAL LOW (ref 12.0–15.0)
MCH: 23.4 pg — ABNORMAL LOW (ref 26.0–34.0)
MCHC: 30.5 g/dL (ref 30.0–36.0)
MCV: 76.8 fL — ABNORMAL LOW (ref 80.0–100.0)
Platelets: 258 10*3/uL (ref 150–400)
RBC: 4.48 MIL/uL (ref 3.87–5.11)
RDW: 17.3 % — ABNORMAL HIGH (ref 11.5–15.5)
WBC: 8.7 10*3/uL (ref 4.0–10.5)
nRBC: 0 % (ref 0.0–0.2)

## 2019-03-11 LAB — POCT PREGNANCY, URINE: Preg Test, Ur: NEGATIVE

## 2019-03-11 MED ORDER — KETOROLAC TROMETHAMINE 30 MG/ML IJ SOLN
15.0000 mg | Freq: Once | INTRAMUSCULAR | Status: AC
  Administered 2019-03-11: 15 mg via INTRAVENOUS
  Filled 2019-03-11: qty 1

## 2019-03-11 MED ORDER — ONDANSETRON HCL 4 MG/2ML IJ SOLN
4.0000 mg | Freq: Once | INTRAMUSCULAR | Status: AC
  Administered 2019-03-11: 4 mg via INTRAVENOUS
  Filled 2019-03-11: qty 2

## 2019-03-11 MED ORDER — OXYCODONE-ACETAMINOPHEN 5-325 MG PO TABS: 1.0000 | ORAL_TABLET | Freq: Three times a day (TID) | ORAL | 0 refills | Status: DC | PRN

## 2019-03-11 MED ORDER — IOHEXOL 300 MG/ML  SOLN
125.0000 mL | Freq: Once | INTRAMUSCULAR | Status: AC | PRN
  Administered 2019-03-11: 150 mL via INTRAVENOUS

## 2019-03-11 MED ORDER — DOCUSATE SODIUM 100 MG PO CAPS: 100.0000 mg | ORAL_CAPSULE | Freq: Every day | ORAL | 2 refills | Status: AC | PRN

## 2019-03-11 NOTE — ED Provider Notes (Signed)
US IMPRESSION: 1. No evidence of ovarian torsion at this time. 2. Large but sonographically simple right ovarian cyst measuring up to 6 cm. 3. IUD in place.  Patient will be referred to OB/GYN for outpatient follow-up.    Earleen Newport, MD 03/11/19 [phone]

## 2019-03-11 NOTE — ED Notes (Signed)
Patient transported to CT 

## 2019-03-11 NOTE — ED Triage Notes (Signed)
Pt states periumbilicar pain for 4 days. Pt states she has not had a bowel movement in 3 days. Pt states one episode of emesis this am. Pt states pain radiates to right lower quadrant when she attempts to defecate.

## 2019-03-11 NOTE — ED Provider Notes (Signed)
[facility] Emergency Department Provider Note  ____________________________________________  Time seen: Approximately 5:40 AM  I have reviewed the triage vital signs and the nursing notes.   HISTORY  Chief Complaint Abdominal Pain   HPI Ann Huber is a 28 y.o. female with history of obesity, cholecystectomy and tubal ligation who presents for evaluation of abdominal pain.  Patient reports 4 days of constant sharp periumbilical abdominal pain.  Today she had nausea and one episode of nonbloody nonbilious emesis.  She reports that her last bowel movement of 2 days ago and that made the pain worse.  No fever or chills, no vaginal discharge, no dysuria or hematuria, no diarrhea, no constipation, no chest pain or shortness of breath.  Currently pain is 8 out of 10.  Patient has not had a menstrual period for a while since she has an IUD.   Past Surgical History:  Procedure Laterality Date   CHOLECYSTECTOMY     Tubes Tied      Prior to Admission medications   Medication Sig Start Date End Date Taking? Authorizing Provider  buPROPion (WELLBUTRIN SR) 150 MG 12 hr tablet Take 150 mg by mouth daily. 11/24/16 11/24/17  [provider]  ondansetron (ZOFRAN ODT) 8 MG disintegrating tablet Take 1 tablet (8 mg total) by mouth 2 (two) times daily. Patient not taking: Reported on 09/01/2017 11/13/15   Lutricia Feiloemer, William P, PA-C  sertraline (ZOLOFT) 100 MG tablet Take 100 mg by mouth daily. 11/24/16   [provider]  sulfamethoxazole-trimethoprim (BACTRIM DS,SEPTRA DS) 800-160 MG tablet Take 1 tablet 2 (two) times daily by mouth. Patient not taking: Reported on 09/01/2017 08/14/17   Rockne MenghiniNorman, Anne-Caroline, MD    Allergies Latex  Family History  Problem Relation Age of Onset   Hypertension Father     Social History Social History   Tobacco Use   Smoking status: Never Smoker   Smokeless tobacco: Never Used  Substance Use Topics    Alcohol use: No   Drug use: No    Review of Systems  Constitutional: Negative for fever. Eyes: Negative for visual changes. ENT: Negative for sore throat. Neck: No neck pain  Cardiovascular: Negative for chest pain. Respiratory: Negative for shortness of breath. Gastrointestinal: + periumbilical abdominal pain, nausea, and vomiting. No diarrhea. Genitourinary: Negative for dysuria. Musculoskeletal: Negative for back pain. Skin: Negative for rash. Neurological: Negative for headaches, weakness or numbness. Psych: No SI or HI  ____________________________________________   PHYSICAL EXAM:  VITAL SIGNS: ED Triage Vitals [03/11/19 0452]  Enc Vitals Group     BP (!) 150/81     Pulse Rate 78     Resp 16     Temp 97.8 F (36.6 C)     Temp Source Oral     SpO2 100 %     Weight 260 lb (117.9 kg)     Height 5\' 6"  (1.676 m)     Head Circumference      Peak Flow      Pain Score 6     Pain Loc      Pain Edu?      Excl. in [place]?     Constitutional: Alert and oriented. Well appearing and in no apparent distress. HEENT:      Head: Normocephalic and atraumatic.         Eyes: Conjunctivae are normal. Sclera is non-icteric.       Mouth/Throat: Mucous membranes are moist.       Neck: Supple with  no signs of meningismus. Cardiovascular: Regular rate and rhythm. No murmurs, gallops, or rubs. 2+ symmetrical distal pulses are present in all extremities. No JVD. Respiratory: Normal respiratory effort. Lungs are clear to auscultation bilaterally. No wheezes, crackles, or rhonchi.  Gastrointestinal: Soft, obese, periumbilical tenderness palpation, and non distended with positive bowel sounds. No rebound or guarding. Genitourinary: No CVA tenderness. Musculoskeletal: Nontender with normal range of motion in all extremities. No edema, cyanosis, or erythema of extremities. Neurologic: Normal speech and language. Face is symmetric. Moving all extremities. No gross focal neurologic deficits are  appreciated. Skin: Skin is warm, dry and intact. No rash noted. Psychiatric: Mood and affect are normal. Speech and behavior are normal.  ____________________________________________   LABS (all labs ordered are listed, but only abnormal results are displayed)  Labs Reviewed  COMPREHENSIVE METABOLIC PANEL - Abnormal; Notable for the following components:      Result Value   Glucose, Bld 113 (*)    Calcium 8.8 (*)    Anion gap 4 (*)    All other components within normal limits  CBC - Abnormal; Notable for the following components:   Hemoglobin 10.5 (*)    HCT 34.4 (*)    MCV 76.8 (*)    MCH 23.4 (*)    RDW 17.3 (*)    All other components within normal limits  URINALYSIS, COMPLETE (UACMP) WITH MICROSCOPIC - Abnormal; Notable for the following components:   Color, Urine YELLOW (*)    APPearance HAZY (*)    All other components within normal limits  POC URINE PREG, ED  POCT PREGNANCY, URINE   ____________________________________________  EKG  none  ____________________________________________  RADIOLOGY  I have personally reviewed the images performed during this visit and I agree with the Radiologist's read.   Interpretation by Radiologist:  Ct Abdomen Pelvis W Contrast  Result Date: 03/11/2019 CLINICAL DATA:  Paraumbilical pain for 4 days. No bowel movement for 3 days. Emesis. Initial encounter. EXAM: CT ABDOMEN AND PELVIS WITH CONTRAST TECHNIQUE: Multidetector CT imaging of the abdomen and pelvis was performed using the standard protocol following bolus administration of intravenous contrast. CONTRAST:  150mL OMNIPAQUE IOHEXOL 300 MG/ML  SOLN COMPARISON:  None. FINDINGS: Lower chest: Lung bases are clear without focal nodule, mass, or airspace disease. Heart size is normal. No significant pleural or pericardial effusion is present. Hepatobiliary: No focal liver abnormality is seen. No gallstones, gallbladder wall thickening, or biliary dilatation. Pancreas: Unremarkable.  No pancreatic ductal dilatation or surrounding inflammatory changes. Spleen: Normal in size without focal abnormality. Adrenals/Urinary Tract: The adrenal glands are normal bilaterally. Kidneys and ureters are within normal limits. There is no stone or mass lesion. The urinary bladder is within normal limits. Stomach/Bowel: The stomach is decompressed. Stomach and duodenum are within normal limits. Small bowel is unremarkable. Terminal ileum is normal. The appendix is visualized and normal. The ascending and transverse colon are within normal limits. The descending and sigmoid colon are normal. Vascular/Lymphatic: No significant vascular findings are present. No enlarged abdominal or pelvic lymph nodes. Reproductive: IUD is in place. A well-defined low-density cystic lesion near the fundus of the uterus measures 6.4 x 4.7 x 5.7 cm. This likely represents an adnexal cyst. No other adnexal lesions are evident. Other: No abdominal wall hernia or abnormality. No abdominopelvic ascites. Musculoskeletal: Vertebral body heights alignment are maintained. No focal lytic or blastic lesions are present. Bony pelvis is within normal limits. The hips are located and normal. IMPRESSION: 1. No acute or focal bowel lesion. 2.  Well-defined low-density lesion at the fundus of the uterus measures up to 6.4 cm. This likely represents an adnexal cyst. Recommend pelvic ultrasound for further evaluation. 3. Otherwise unremarkable CT of the abdomen and pelvis. No other acute or focal lesion to explain the patient's periumbilical abdominal pain, constipation, or emesis. Electronically Signed   By: San Morelle M.D.   On: 03/11/2019 06:27      ____________________________________________   PROCEDURES  Procedure(s) performed: None Procedures Critical Care performed:  None ____________________________________________   INITIAL IMPRESSION / ASSESSMENT AND PLAN / ED COURSE   28 y.o. female with history of obesity,  cholecystectomy and tubal ligation who presents for evaluation of periumbilical abdominal pain x4 days.  Patient is well-appearing and in no distress, she has mild periumbilical tenderness to palpation with no rebound or guarding, remaining of her abdominal exam shows no tenderness.  Vitals are within normal limits.  Differential diagnosis including appendicitis versus constipation versus UTI versus ovarian pathology versus ectopic versus STD versus PID.  Labs showing normal white count, normal electrolytes, no anemia, urinalysis negative for UTI.  Pregnancy test is negative.  CT abdomen pelvis is pending.  We will treat the pain with Toradol.    _________________________ 6:43 AM on 03/11/2019 -----------------------------------------  CT showing a 6.7 cm round mass in the pelvis which is concern for possible adnexal cyst.  Transvaginal ultrasound has been ordered.  Care transferred to Dr. Jimmye Norman.   As part of my medical decision making, I reviewed the following data within the [place] notes reviewed and incorporated, Labs reviewed , Old chart reviewed, Radiograph reviewed , Notes from prior ED visits and [place] Controlled Substance Database    Pertinent labs & imaging results that were available during my care of the patient were reviewed by me and considered in my medical decision making (see chart for details).    ____________________________________________   FINAL CLINICAL IMPRESSION(S) / ED DIAGNOSES  Final diagnoses:  Abdominal pain  Abdominal pain      NEW MEDICATIONS STARTED DURING THIS VISIT:  ED Discharge Orders    None       Note:  This document was prepared using Dragon voice recognition software and may include unintentional dictation errors.    Alfred Levins, [place], MD 03/11/19 [phone]

## 2019-03-11 NOTE — ED Notes (Signed)
Patient transported to Ultrasound 

## 2019-07-04 ENCOUNTER — Emergency Department: Payer: Self-pay

## 2019-07-04 ENCOUNTER — Encounter: Payer: Self-pay | Admitting: Emergency Medicine

## 2019-07-04 ENCOUNTER — Emergency Department
Admission: EM | Admit: 2019-07-04 | Discharge: 2019-07-04 | Disposition: A | Discharge status: Home / Self Care | Payer: Self-pay | Attending: Emergency Medicine | Admitting: Emergency Medicine

## 2019-07-04 ENCOUNTER — Other Ambulatory Visit: Payer: Self-pay

## 2019-07-04 DIAGNOSIS — R519 Headache, unspecified: Secondary | ICD-10-CM | POA: Insufficient documentation

## 2019-07-04 DIAGNOSIS — Z79899 Other long term (current) drug therapy: Secondary | ICD-10-CM | POA: Insufficient documentation

## 2019-07-04 DIAGNOSIS — R221 Localized swelling, mass and lump, neck: Secondary | ICD-10-CM | POA: Insufficient documentation

## 2019-07-04 DIAGNOSIS — H5789 Other specified disorders of eye and adnexa: Secondary | ICD-10-CM | POA: Insufficient documentation

## 2019-07-04 DIAGNOSIS — L03211 Cellulitis of face: Secondary | ICD-10-CM | POA: Insufficient documentation

## 2019-07-04 DIAGNOSIS — Z91040 Latex allergy status: Secondary | ICD-10-CM | POA: Insufficient documentation

## 2019-07-04 LAB — CBC WITH DIFFERENTIAL/PLATELET
Abs Immature Granulocytes: 0.02 10*3/uL (ref 0.00–0.07)
Basophils Absolute: 0 10*3/uL (ref 0.0–0.1)
Basophils Relative: 0 %
Eosinophils Absolute: 0.1 10*3/uL (ref 0.0–0.5)
Eosinophils Relative: 1 %
HCT: 36.1 % (ref 36.0–46.0)
Hemoglobin: 11.7 g/dL — ABNORMAL LOW (ref 12.0–15.0)
Immature Granulocytes: 0 %
Lymphocytes Relative: 23 %
Lymphs Abs: 1.7 10*3/uL (ref 0.7–4.0)
MCH: 25.8 pg — ABNORMAL LOW (ref 26.0–34.0)
MCHC: 32.4 g/dL (ref 30.0–36.0)
MCV: 79.5 fL — ABNORMAL LOW (ref 80.0–100.0)
Monocytes Absolute: 0.6 10*3/uL (ref 0.1–1.0)
Monocytes Relative: 8 %
Neutro Abs: 5 10*3/uL (ref 1.7–7.7)
Neutrophils Relative %: 68 %
Platelets: 226 10*3/uL (ref 150–400)
RBC: 4.54 MIL/uL (ref 3.87–5.11)
RDW: 14.7 % (ref 11.5–15.5)
WBC: 7.5 10*3/uL (ref 4.0–10.5)
nRBC: 0 % (ref 0.0–0.2)

## 2019-07-04 LAB — COMPREHENSIVE METABOLIC PANEL
ALT: 18 U/L (ref 0–44)
AST: 19 U/L (ref 15–41)
Albumin: 4.3 g/dL (ref 3.5–5.0)
Alkaline Phosphatase: 72 U/L (ref 38–126)
Anion gap: 7 (ref 5–15)
BUN: 8 mg/dL (ref 6–20)
CO2: 27 mmol/L (ref 22–32)
Calcium: 9.4 mg/dL (ref 8.9–10.3)
Chloride: 106 mmol/L (ref 98–111)
Creatinine, Ser: 0.64 mg/dL (ref 0.44–1.00)
GFR calc Af Amer: >60 mL/min (ref >60)
GFR calc non Af Amer: >60 mL/min (ref >60)
Glucose, Bld: 104 mg/dL — ABNORMAL HIGH (ref 70–99)
Potassium: 3.8 mmol/L (ref 3.5–5.1)
Sodium: 140 mmol/L (ref 135–145)
Total Bilirubin: 0.6 mg/dL (ref 0.3–1.2)
Total Protein: 7.6 g/dL (ref 6.5–8.1)

## 2019-07-04 MED ORDER — CEPHALEXIN 500 MG PO CAPS: 500.0000 mg | ORAL_CAPSULE | Freq: Three times a day (TID) | ORAL | 0 refills | Status: AC

## 2019-07-04 MED ORDER — DEXAMETHASONE SODIUM PHOSPHATE 10 MG/ML IJ SOLN
10.0000 mg | Freq: Once | INTRAMUSCULAR | Status: AC
  Administered 2019-07-04: 18:00:00 10 mg via INTRAVENOUS
  Filled 2019-07-04: qty 1

## 2019-07-04 MED ORDER — SODIUM CHLORIDE 0.9 % IV SOLN
1.0000 g | Freq: Once | INTRAVENOUS | Status: AC
  Administered 2019-07-04: 18:00:00 1 g via INTRAVENOUS
  Filled 2019-07-04: qty 10

## 2019-07-04 MED ORDER — IOHEXOL 300 MG/ML  SOLN
75.0000 mL | Freq: Once | INTRAMUSCULAR | Status: AC | PRN
  Administered 2019-07-04: 17:00:00 75 mL via INTRAVENOUS
  Filled 2019-07-04: qty 75

## 2019-07-04 MED ORDER — TOBRAMYCIN 0.3 % OP SOLN: 2.0000 [drp] | OPHTHALMIC | 0 refills | Status: DC

## 2019-07-04 MED ORDER — PREDNISONE 10 MG (21) PO TBPK: ORAL_TABLET | ORAL | 0 refills | Status: DC

## 2019-07-04 NOTE — Discharge Instructions (Signed)
Follow-up with your regular doctor if not better in 3 to 4 days.  Return emergency department worsening.  If you continue to have pain in the eye please follow-up with [facility].  Take medications as prescribed.

## 2019-07-04 NOTE — ED Triage Notes (Addendum)
Pt states she has had left temple pain since last Friday, then on Monday, began having swelling to her left eye and left sided facial pain. Denies known insect bite. 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 NAD. No respiratory distress or discomfort per pt.

## 2019-07-04 NOTE — ED Notes (Signed)
Says left eye swelling and now it is swelling in left face.  Pressure pain constant.  No itching and does not notice exudate upon waking.

## 2019-07-04 NOTE — ED Provider Notes (Signed)
[facility] Emergency Department Provider Note  ____________________________________________   First MD Initiated Contact with Patient 07/04/19 1435     (approximate)  I have reviewed the triage vital signs and the nursing notes.   HISTORY  Chief Complaint Headache and Facial Swelling    HPI Ann Huber is a 28 y.o. female presents emergency department complaining of left-sided IM facial swelling.  Also some left-sided neck swelling.  No fever or chills.  No change in her vision.  Symptoms have been ongoing since Monday.  No known injury.  She states the pain behind the eye is a throbbing type pain.  No difficulty with opening and closing her jaw.  States that the left eye has been red but no drainage.    History reviewed. No pertinent past medical history.  There are no active problems to display for this patient.   Past Surgical History:  Procedure Laterality Date  . CHOLECYSTECTOMY    . Tubes Tied      Prior to Admission medications   Medication Sig Start Date End Date Taking? Authorizing Provider  hydrochlorothiazide (HYDRODIURIL) 25 MG tablet Take 25 mg by mouth daily.   Yes [provider]  buPROPion (WELLBUTRIN SR) 150 MG 12 hr tablet Take 150 mg by mouth daily. 11/24/16 11/24/17  [provider]  cephALEXin (KEFLEX) 500 MG capsule Take 1 capsule (500 mg total) by mouth 3 (three) times daily for 10 days. 07/04/19 07/14/19  Karmina Zufall, Roselyn BeringSusan W, PA-C  docusate sodium (COLACE) 100 MG capsule Take 1 capsule (100 mg total) by mouth daily as needed. 03/11/19 03/10/20  Emily FilbertWilliams, Jonathan E, MD  ondansetron (ZOFRAN ODT) 8 MG disintegrating tablet Take 1 tablet (8 mg total) by mouth 2 (two) times daily. Patient not taking: Reported on 09/01/2017 11/13/15   Lutricia Feiloemer, William P, PA-C  oxyCODONE-acetaminophen (PERCOCET) 5-325 MG tablet Take 1 tablet by mouth every 8 (eight) hours as needed. 03/11/19   Emily FilbertWilliams, Jonathan E, MD  predniSONE  (STERAPRED UNI-PAK 21 TAB) 10 MG (21) TBPK tablet Take 6 pills on day one then decrease by 1 pill each day 07/04/19   Faythe GheeFisher, Zamani Crocker W, PA-C  sertraline (ZOLOFT) 100 MG tablet Take 100 mg by mouth daily. 11/24/16   [provider]  sulfamethoxazole-trimethoprim (BACTRIM DS,SEPTRA DS) 800-160 MG tablet Take 1 tablet 2 (two) times daily by mouth. Patient not taking: Reported on 09/01/2017 08/14/17   Rockne MenghiniNorman, Anne-Caroline, MD  tobramycin (TOBREX) 0.3 % ophthalmic solution Place 2 drops into both eyes every 4 (four) hours. 07/04/19   Faythe GheeFisher, Brock Larmon W, PA-C    Allergies Latex  Family History  Problem Relation Age of Onset  . Hypertension Father     Social History Social History   Tobacco Use  . Smoking status: Never Smoker  . Smokeless tobacco: Never Used  Substance Use Topics  . Alcohol use: No  . Drug use: No    Review of Systems  Constitutional: No fever/chills Eyes: No visual changes.  Positive for left eye redness and pain with swelling of the eye ENT: No sore throat. Respiratory: Denies cough Genitourinary: Negative for dysuria. Musculoskeletal: Negative for back pain. Skin: Negative for rash.    ____________________________________________   PHYSICAL EXAM:  VITAL SIGNS: ED Triage Vitals  Enc Vitals Group     BP 07/04/19 1353 (!) 160/100     Pulse Rate 07/04/19 1353 (!) 125     Resp 07/04/19 1353 16     Temp 07/04/19 1353 98.3 F (36.8  C)     Temp Source 07/04/19 1353 Oral     SpO2 07/04/19 1353 97 %     Weight 07/04/19 1355 260 lb (117.9 kg)     Height 07/04/19 1355 5\' 5"  (1.651 m)     Head Circumference --      Peak Flow --      Pain Score 07/04/19 1355 4     Pain Loc --      Pain Edu? --      Excl. in [place]? --     Constitutional: Alert and oriented. Well appearing and in no acute distress. Eyes: Conjunctiva injected in the left eye, swelling noted periorbital area, pain is reproduced with vertical movement. Head: Atraumatic. Nose: No  congestion/rhinnorhea. Mouth/Throat: Mucous membranes are moist.  Throat appears normal Neck:  supple positive lymphadenopathy noted, area is tender to palpation Cardiovascular: Normal rate, regular rhythm. Heart sounds are normal Respiratory: Normal respiratory effort.  No retractions, lungs c t a  GU: deferred Musculoskeletal: FROM all extremities, warm and well perfused Neurologic:  Normal speech and language.  Skin:  Skin is warm, dry and intact. No rash noted.  No vesicles noted Psychiatric: Mood and affect are normal. Speech and behavior are normal.  ____________________________________________   LABS (all labs ordered are listed, but only abnormal results are displayed)  Labs Reviewed  COMPREHENSIVE METABOLIC PANEL - Abnormal; Notable for the following components:      Result Value   Glucose, Bld 104 (*)    All other components within normal limits  CBC WITH DIFFERENTIAL/PLATELET - Abnormal; Notable for the following components:   Hemoglobin 11.7 (*)    MCV 79.5 (*)    MCH 25.8 (*)    All other components within normal limits   ____________________________________________   ____________________________________________  RADIOLOGY  CT maxillofacial and CT soft tissue the neck shows some thickening typical of cellulitis, no orbital cellulitis, some reactive type lymphadenopathy noted  ____________________________________________   PROCEDURES  Procedure(s) performed: Rocephin 1 g IV, Decadron 10 mg IV   Procedures    ____________________________________________   INITIAL IMPRESSION / ASSESSMENT AND PLAN / ED COURSE  Pertinent labs & imaging results that were available during my care of the patient were reviewed by me and considered in my medical decision making (see chart for details).   Patient is 28 year old female presents emergency department with cellulitis around the left eye with eye pain upon movement.  Some swelling noted at the face and into the  upper aspect of the neck  CBC is normal, metabolic panel is normal CT maxillofacial soft tissue of the neck with IV contrast ordered  CT maxillofacial does not show any orbital cellulitis but does show some thickening of the soft tissues which is typical of the emerging cellulitis.  Patient was given Rocephin 1 g IV, Decadron 10 mg IV.  She was given a prescription for Keflex 500 3 times daily and tobramycin ophthalmic drops.  Sterapred 10 mg 6-day Dosepak.  She is to follow-up with her regular doctor if not better in 3 days.  Return emergency department worsening.  If continued eye pain she needs follow-up with [facility].  She states she understands will comply.  She is discharged stable condition.   Ann Huber was evaluated in Emergency Department on 07/04/2019 for the symptoms described in the history of present illness. She was evaluated in the context of the global COVID-19 pandemic, which necessitated consideration that the patient might be at risk for infection  with the SARS-CoV-2 virus that causes COVID-19. Institutional protocols and algorithms that pertain to the evaluation of patients at risk for COVID-19 are in a state of rapid change based on information released by regulatory bodies including the CDC and federal and state organizations. These policies and algorithms were followed during the patient's care in the ED.   As part of my medical decision making, I reviewed the following data within the electronic MEDICAL RECORD NUMBER Nursing notes reviewed and incorporated, Labs reviewed see above, Old chart reviewed, Radiograph reviewed CT maxillofacial negative for orbital cellulitis but does show some soft tissue swelling, Notes from prior ED visits and [place] Controlled Substance Database  ____________________________________________   FINAL CLINICAL IMPRESSION(S) / ED DIAGNOSES  Final diagnoses:  Facial cellulitis      NEW MEDICATIONS STARTED DURING THIS VISIT:  New  Prescriptions   CEPHALEXIN (KEFLEX) 500 MG CAPSULE    Take 1 capsule (500 mg total) by mouth 3 (three) times daily for 10 days.   PREDNISONE (STERAPRED UNI-PAK 21 TAB) 10 MG (21) TBPK TABLET    Take 6 pills on day one then decrease by 1 pill each day   TOBRAMYCIN (TOBREX) 0.3 % OPHTHALMIC SOLUTION    Place 2 drops into both eyes every 4 (four) hours.     Note:  This document was prepared using Dragon voice recognition software and may include unintentional dictation errors.    Faythe Ghee, PA-C 07/04/19 1817    Sharman Cheek, MD 07/06/19 [phone]

## 2019-07-05 ENCOUNTER — Other Ambulatory Visit: Payer: Self-pay

## 2019-07-05 DIAGNOSIS — Z20822 Contact with and (suspected) exposure to covid-19: Secondary | ICD-10-CM

## 2019-07-06 LAB — NOVEL CORONAVIRUS, NAA: SARS-CoV-2, NAA: NOT DETECTED

## 2019-07-11 ENCOUNTER — Other Ambulatory Visit: Payer: Self-pay

## 2019-07-11 DIAGNOSIS — Z20822 Contact with and (suspected) exposure to covid-19: Secondary | ICD-10-CM

## 2019-07-12 LAB — NOVEL CORONAVIRUS, NAA: SARS-CoV-2, NAA: NOT DETECTED

## 2019-10-16 IMAGING — US ARTERIAL AND VENOUS ULTRASOUND OF THE ABDOMEN PELVIS AND SCROTUM
1 series · 13 of 25 positions shown · non-contrast
Comparison: CT scan of the abdomen and pelvis 03/11/2019

CLINICAL DATA: 27-year-old female with right lower quadrant pain

EXAM:
TRANSABDOMINAL AND TRANSVAGINAL ULTRASOUND OF PELVIS
DOPPLER ULTRASOUND OF OVARIES
TECHNIQUE: Both transabdominal and transvaginal ultrasound examinations of the
pelvis were performed. Transabdominal technique was performed for
global imaging of the pelvis including uterus, ovaries, adnexal
regions, and pelvic cul-de-sac.
It was necessary to proceed with endovaginal exam following the
transabdominal exam to visualize the right ovary. Color and duplex
Doppler ultrasound was utilized to evaluate blood flow to the
ovaries.

[Series 1: arterial and venous ultrasound of the abdomen pelv · 13 of 96 slices shown]
[im 1/96]
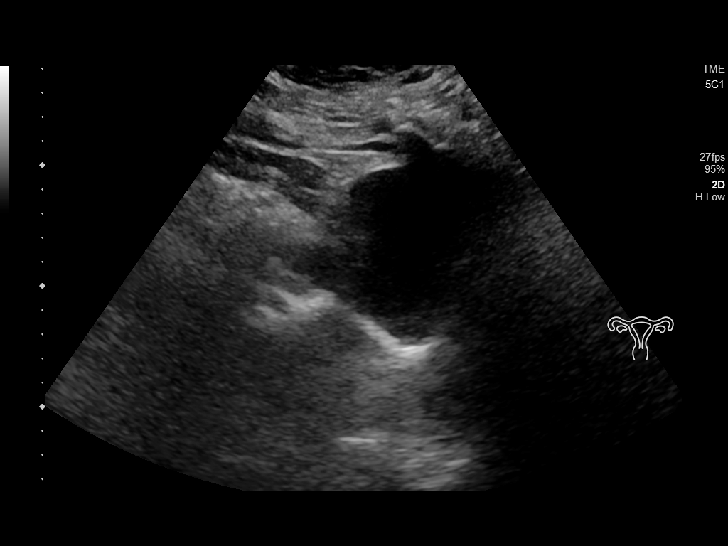
[im 8/96]
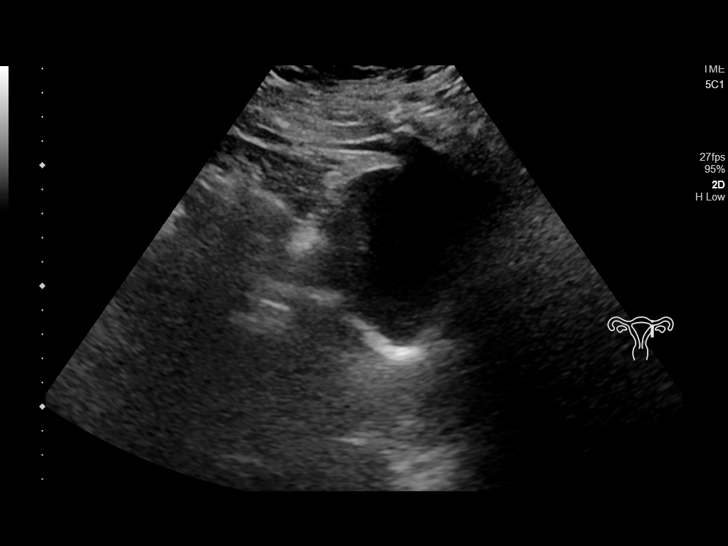
[im 16/96]
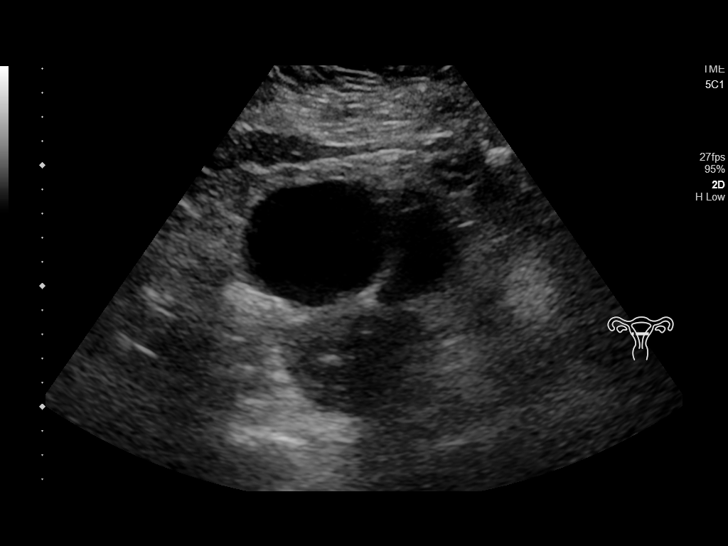
[im 24/96]
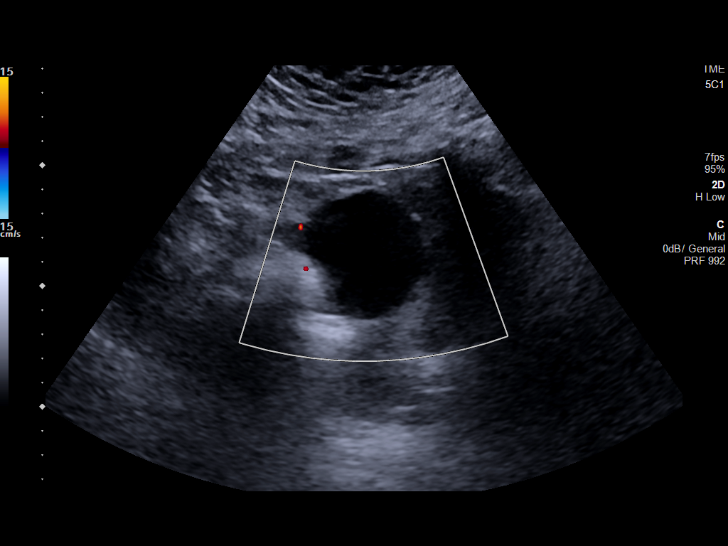
[im 32/96]
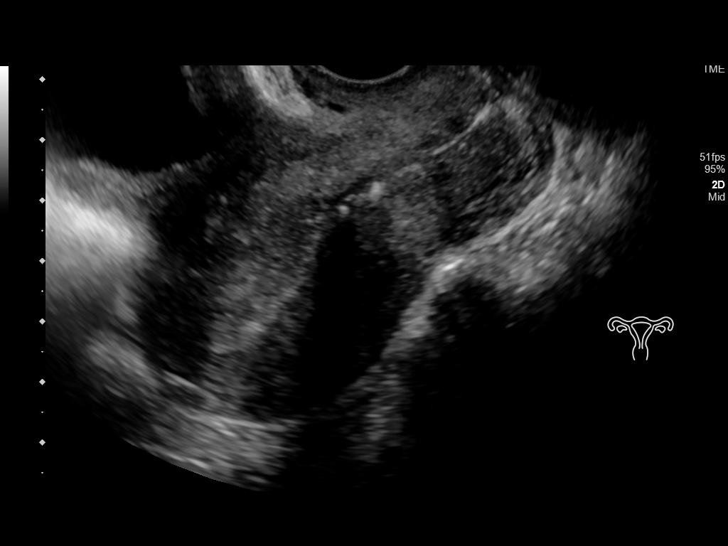
[im 40/96]
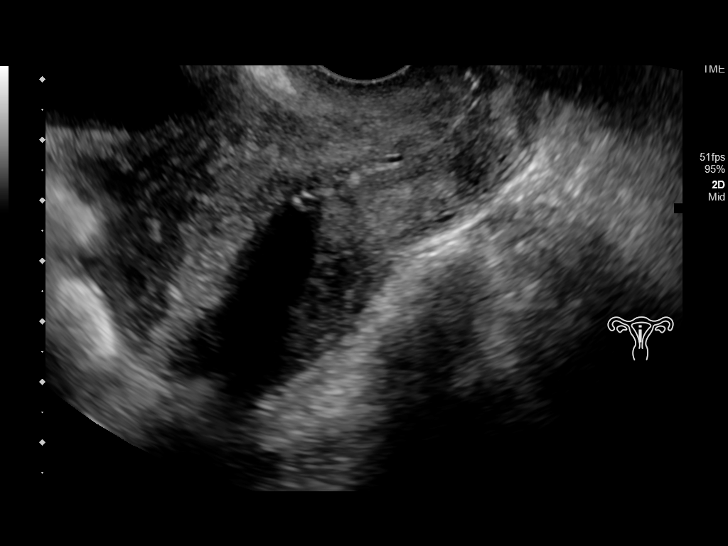
[im 48/96]
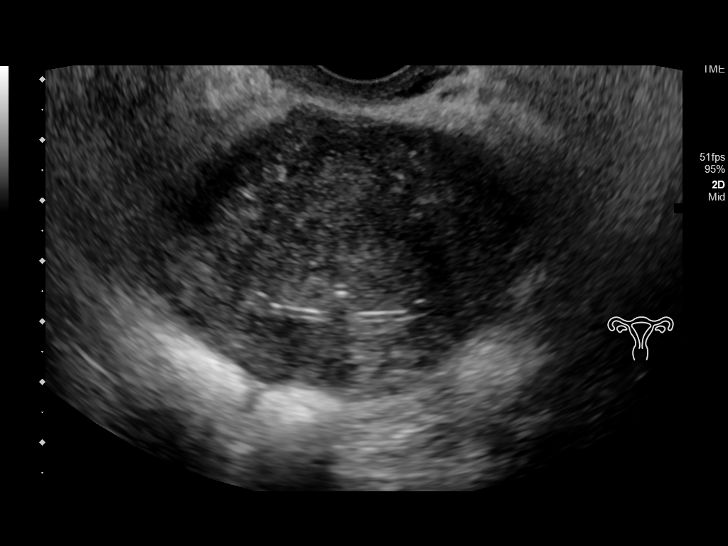
[im 56/96]
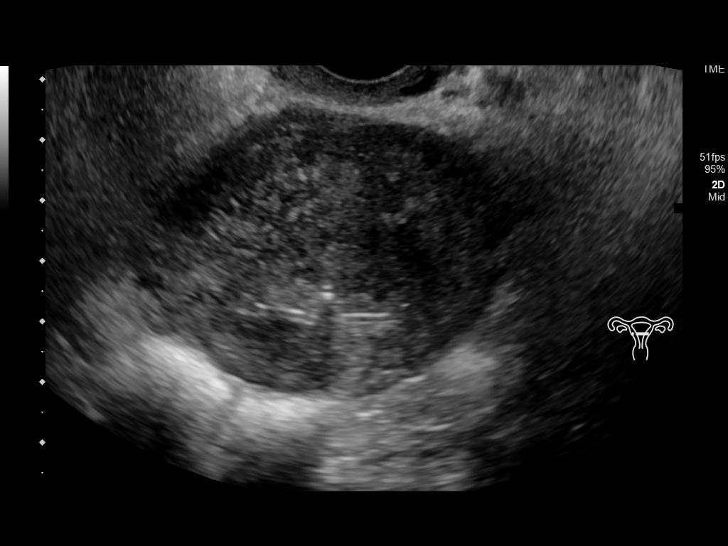
[im 64/96]
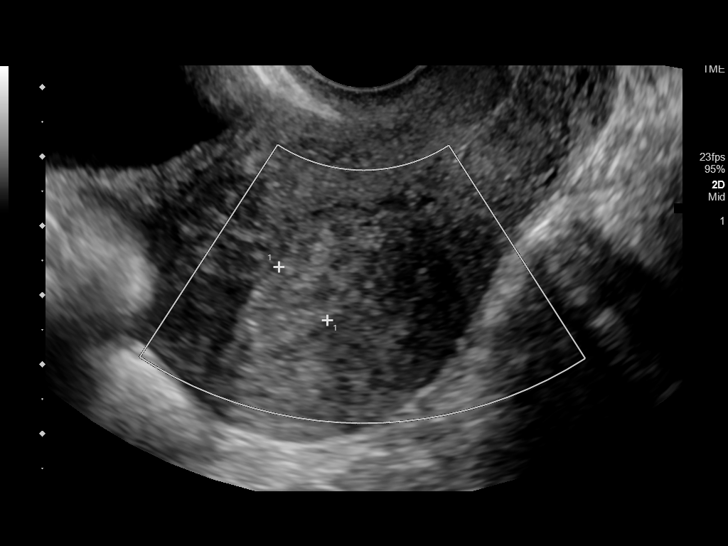
[im 72/96]
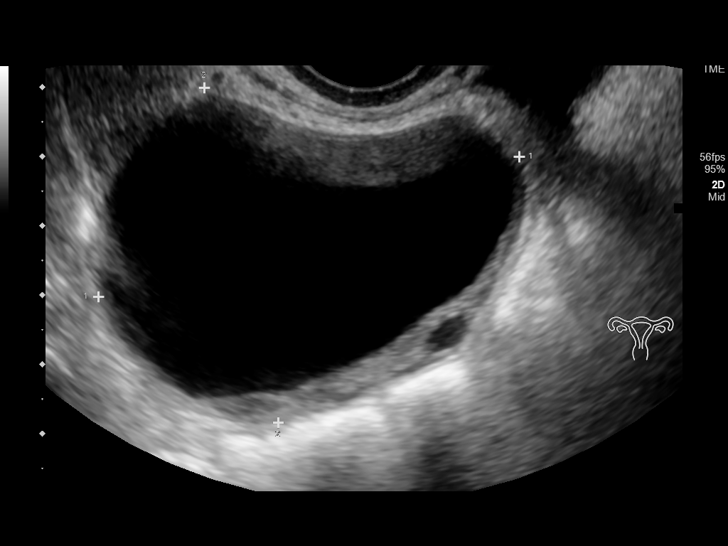
[im 80/96]
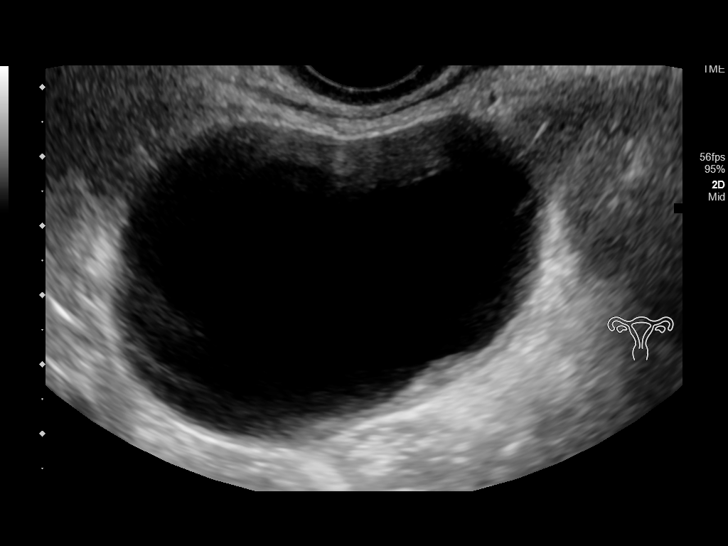
[im 88/96]
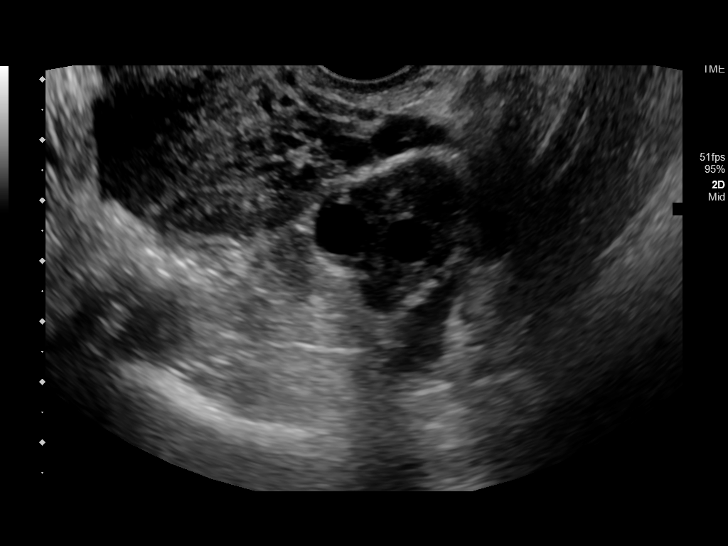
[im 96/96]
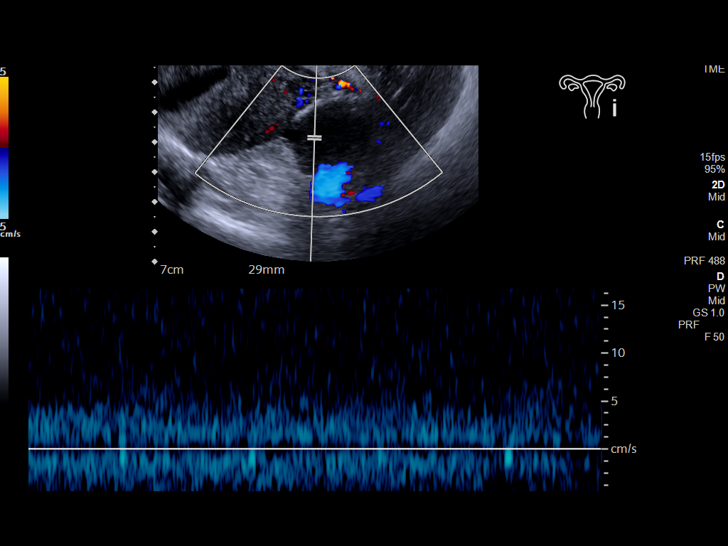

[13 of 25 positions shown; findings below may reference images not displayed]

FINDINGS: Uterus

Measurements: 7.8 x 4.4 x 5 4 cm = volume: 97 mL. No fibroids or
other mass visualized.

Endometrium

Thickness: 10 mm. Linear echogenic focus within the endometrium
canal consistent with known IUD.

Right ovary

Measurements: 6.4 x 5.0 x 6 4 cm = volume: 106 mL. Circumscribed
anechoic cystic lesion with posterior acoustic enhancement and
imperceptible wall within the right ovary. No evidence of mural
nodularity or internal vascularity. The cyst measures 5.5 x 4 6 x
6.0 cm.

Left ovary

Measurements: Ue-7.7 x 1.9 x 2.4 cm = volume: 7 mL. Normal
appearance/no adnexal mass.

Pulsed Doppler evaluation of both ovaries demonstrates normal
low-resistance arterial and venous waveforms.

Other findings

No abnormal free fluid.
IMPRESSION: 1. No evidence of ovarian torsion at this time.
2. Large but sonographically simple right ovarian cyst measuring up
to 6 cm.
3. IUD in place.

## 2019-10-16 IMAGING — CT CT ABDOMEN AND PELVIS WITH CONTRAST
2 of 4 series · 15 of 46 positions shown, 17 images · IV contrast (omnipaque)
Comparison: None.

CLINICAL DATA: Paraumbilical pain for 4 days. No bowel movement for
3 days. Emesis. Initial encounter.

EXAM:
CT ABDOMEN AND PELVIS WITH CONTRAST
TECHNIQUE: Multidetector CT imaging of the abdomen and pelvis was performed
using the standard protocol following bolus administration of
intravenous contrast.
CONTRAST:  150mL OMNIPAQUE IOHEXOL 300 MG/ML  SOLN

[Series 2: routine abd/pel with · axial · 0.93mm/px · z∈[-1150,-660]mm · 12 of 108 slices shown, 14 images]
[im 5/108  soft-tissue]
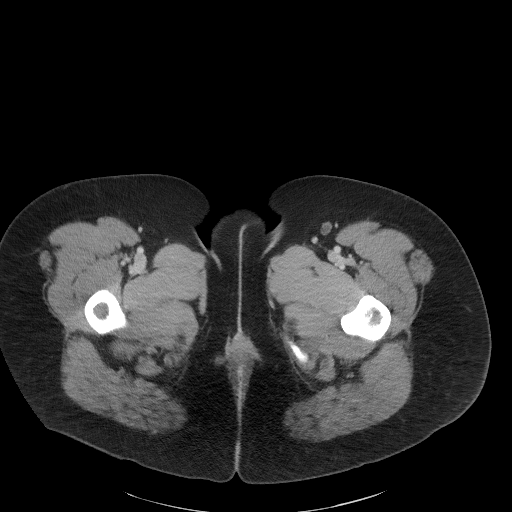
[im 5/108  bone]
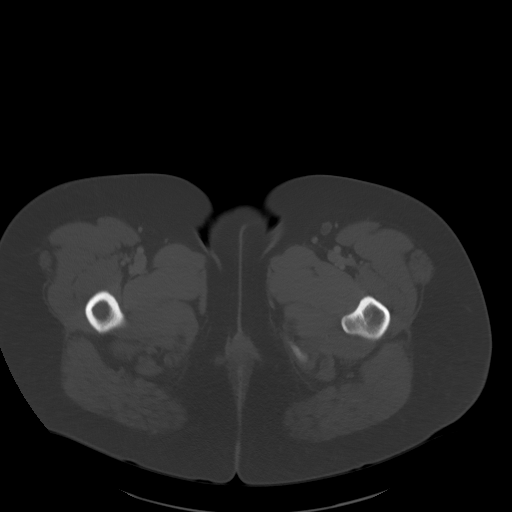
[im 15/108  soft-tissue]
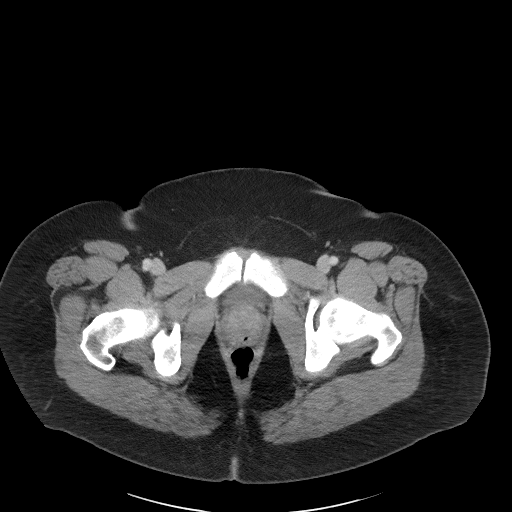
[im 25/108  soft-tissue]
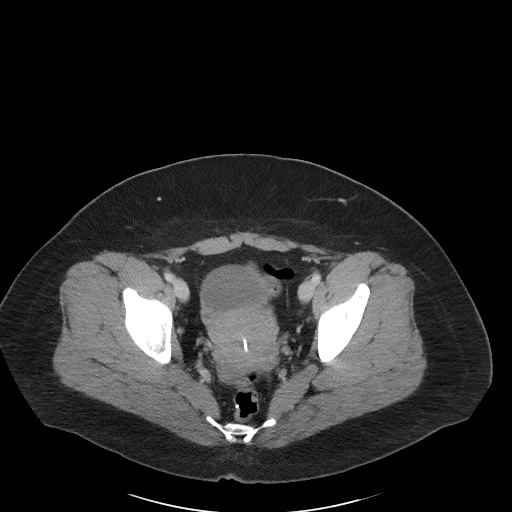
[im 35/108  soft-tissue]
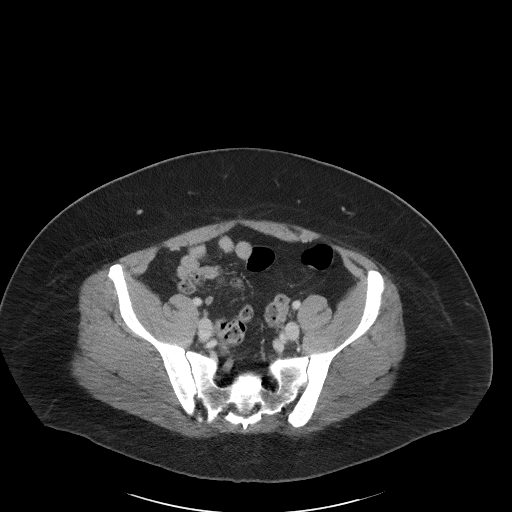
[im 39/108  soft-tissue]
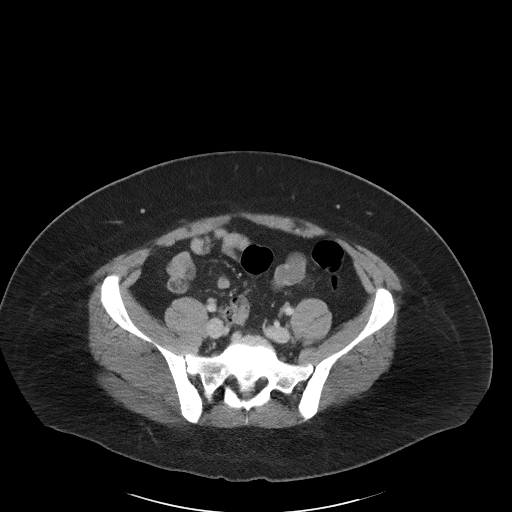
[im 49/108  soft-tissue]
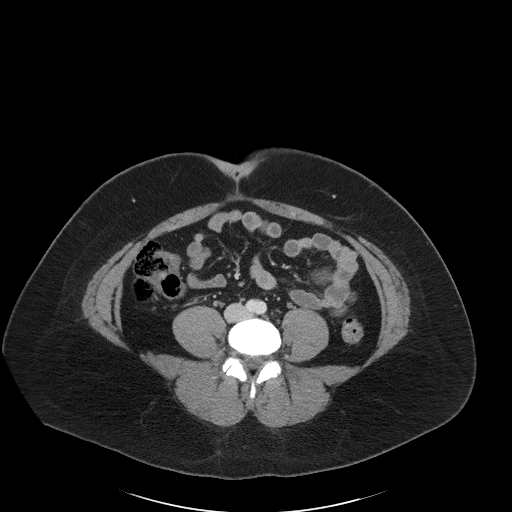
[im 59/108  soft-tissue]
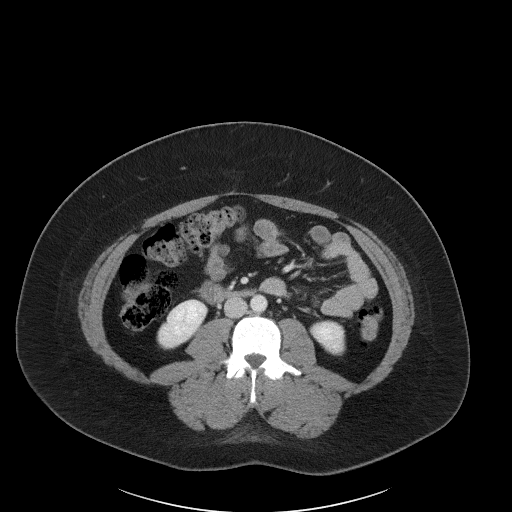
[im 69/108  soft-tissue]
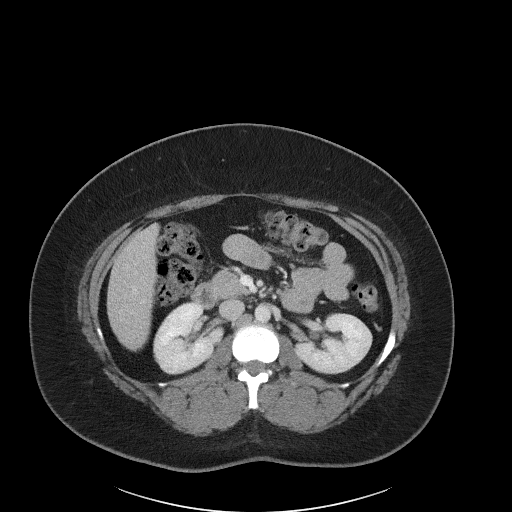
[im 73/108  soft-tissue]
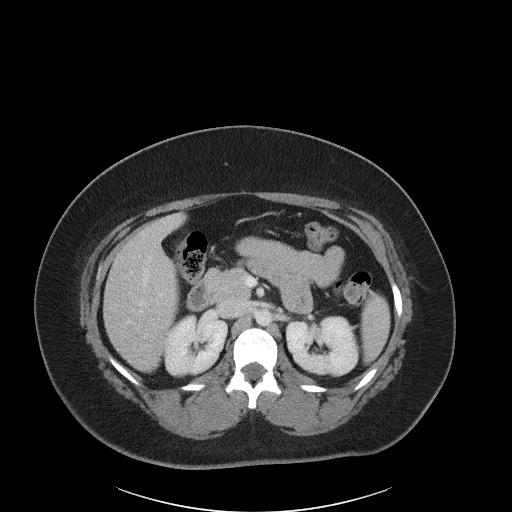
[im 73/108  bone]
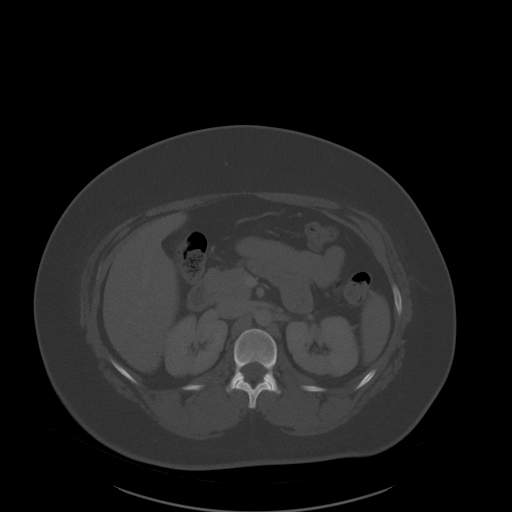
[im 83/108  soft-tissue]
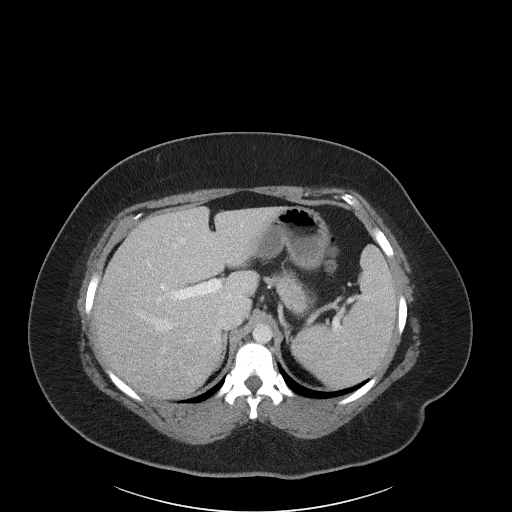
[im 93/108  soft-tissue]
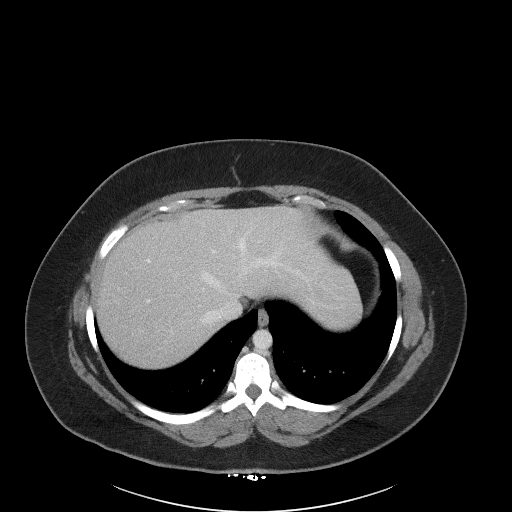
[im 103/108  soft-tissue]
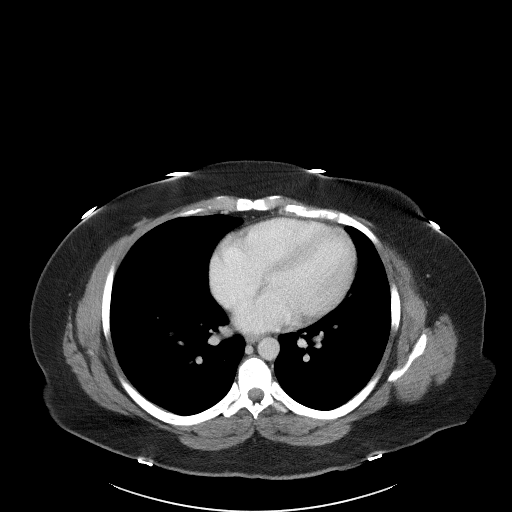

[Series 5: coronal st · coronal · 0.86mm/px · 3 of 106 slices shown]
[im 36/106  soft-tissue]
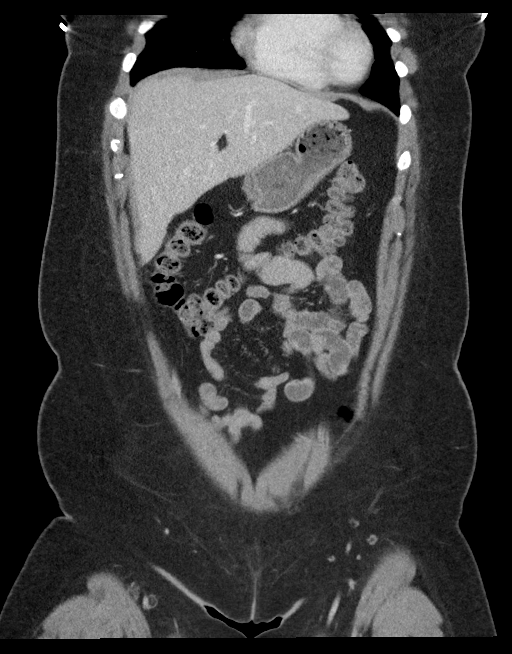
[im 47/106  soft-tissue]
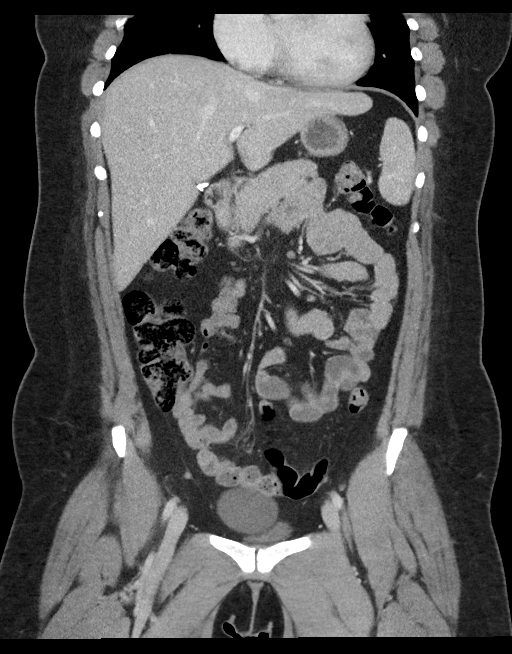
[im 59/106  soft-tissue]
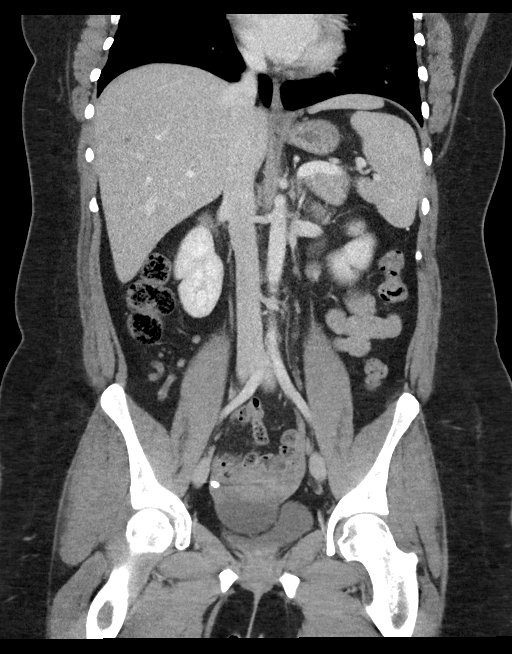

[15 of 46 positions shown; findings below may reference images not displayed]

FINDINGS: Lower chest: Lung bases are clear without focal nodule, mass, or
airspace disease. Heart size is normal. No significant pleural or
pericardial effusion is present.

Hepatobiliary: No focal liver abnormality is seen. No gallstones,
gallbladder wall thickening, or biliary dilatation.

Pancreas: Unremarkable. No pancreatic ductal dilatation or
surrounding inflammatory changes.

Spleen: Normal in size without focal abnormality.

Adrenals/Urinary Tract: The adrenal glands are normal bilaterally.
Kidneys and ureters are within normal limits. There is no stone or
mass lesion. The urinary bladder is within normal limits.

Stomach/Bowel: The stomach is decompressed. Stomach and duodenum are
within normal limits. Small bowel is unremarkable. Terminal ileum is
normal. The appendix is visualized and normal. The ascending and
transverse colon are within normal limits. The descending and
sigmoid colon are normal.

Vascular/Lymphatic: No significant vascular findings are present. No
enlarged abdominal or pelvic lymph nodes.

Reproductive: IUD is in place. A well-defined low-density cystic
lesion near the fundus of the uterus measures 6.4 x 4.7 x 5.7 cm.
This likely represents an adnexal cyst. No other adnexal lesions are
evident.

Other: No abdominal wall hernia or abnormality. No abdominopelvic
ascites.

Musculoskeletal: Vertebral body heights alignment are maintained. No
focal lytic or blastic lesions are present. Bony pelvis is within
normal limits. The hips are located and normal.
IMPRESSION: 1. No acute or focal bowel lesion.
2. Well-defined low-density lesion at the fundus of the uterus
measures up to 6.4 cm. This likely represents an adnexal cyst.
Recommend pelvic ultrasound for further evaluation.
3. Otherwise unremarkable CT of the abdomen and pelvis. No other
acute or focal lesion to explain the patient's periumbilical
abdominal pain, constipation, or emesis.

## 2020-08-26 ENCOUNTER — Ambulatory Visit
Admission: EM | Admit: 2020-08-26 | Discharge: 2020-08-26 | Disposition: A | Discharge status: Home / Self Care | Payer: 59 | Attending: Emergency Medicine | Admitting: Emergency Medicine

## 2020-08-26 ENCOUNTER — Encounter: Payer: Self-pay | Admitting: Emergency Medicine

## 2020-08-26 ENCOUNTER — Other Ambulatory Visit: Payer: Self-pay

## 2020-08-26 DIAGNOSIS — H66003 Acute suppurative otitis media without spontaneous rupture of ear drum, bilateral: Secondary | ICD-10-CM | POA: Diagnosis not present

## 2020-08-26 MED ORDER — AMOXICILLIN-POT CLAVULANATE 875-125 MG PO TABS: 1.0000 | ORAL_TABLET | Freq: Two times a day (BID) | ORAL | 0 refills | Status: AC

## 2020-08-26 NOTE — Discharge Instructions (Addendum)
Go home and take your blood pressure medicine.  Take the Augmentin twice daily with food for 10 days.  Take an over-the-counter probiotic 1 hour after each dose of antibiotic to prevent diarrhea.  Use over-the-counter Tylenol and ibuprofen as needed for pain.  If you have a worsening of the dizziness, such as if you develop room spinning or vertigo symptoms, you need to return for reevaluation or go to the emergency department.

## 2020-08-26 NOTE — ED Triage Notes (Signed)
Patient c/o right ear pain that started 1 week ago. Denies any other symptoms.

## 2020-08-26 NOTE — ED Provider Notes (Signed)
[facility]    CSN: [id number] Arrival date & time: 08/26/20  1542      History   Chief Complaint Chief Complaint  Patient presents with   Otalgia    HPI Ann Huber is a 29 y.o. female.   HPI   29 year old female here for evaluation of right ear pain.  Patient reports that she has been having pain in her right ear for the past week.  She has had some intermittent dizziness and ringing in that ear as well.  Patient denies fever, discharge from her ear, changes to her hearing, nasal congestion or runny nose.  Patient's blood pressure is elevated at 158/104 but patient reports that she did not take her hydrochlorothiazide today.  Patient denies chest pain, shortness of breath, or headache.  History reviewed. No pertinent past medical history.  There are no problems to display for this patient.   Past Surgical History:  Procedure Laterality Date   CHOLECYSTECTOMY     Tubes Tied      OB History   No obstetric history on file.      Home Medications    Prior to Admission medications   Medication Sig Start Date End Date Taking? Authorizing Provider  hydrochlorothiazide (HYDRODIURIL) 25 MG tablet Take 25 mg by mouth daily.  08/26/20 Yes [provider]  amoxicillin-clavulanate (AUGMENTIN) 875-125 MG tablet Take 1 tablet by mouth every 12 (twelve) hours for 10 days. 08/26/20 09/05/20  Becky Augusta, NP  buPROPion [facility] SR) 150 MG 12 hr tablet Take 150 mg by mouth daily. 11/24/16 11/24/17  [provider]  sertraline (ZOLOFT) 100 MG tablet Take 100 mg by mouth daily. 11/24/16 08/26/20  [provider]    Family History Family History  Problem Relation Age of Onset   Hypertension Father     Social History Social History   Tobacco Use   Smoking status: Never Smoker   Smokeless tobacco: Never Used  Substance Use Topics   Alcohol use: No   Drug use: No     Allergies   Latex   Review of Systems Review  of Systems  Constitutional: Negative for activity change, appetite change, fatigue and fever.  HENT: Positive for ear pain. Negative for congestion, hearing loss, rhinorrhea and sore throat.   Respiratory: Negative for shortness of breath and wheezing.   Cardiovascular: Negative for chest pain.  Gastrointestinal: Negative for nausea and vomiting.  Musculoskeletal: Negative for arthralgias and gait problem.  Skin: Negative for rash.  Neurological: Positive for dizziness. Negative for syncope and headaches.  Hematological: Negative.   Psychiatric/Behavioral: Negative.      Physical Exam Triage Vital Signs ED Triage Vitals  Enc Vitals Group     BP 08/26/20 1702 (!) 158/104     Pulse Rate 08/26/20 1702 98     Resp 08/26/20 1702 18     Temp 08/26/20 1702 98.3 F (36.8 C)     Temp Source 08/26/20 1702 Oral     SpO2 08/26/20 1702 100 %     Weight 08/26/20 1703 260 lb (117.9 kg)     Height 08/26/20 1703 5\' 5"  (1.651 m)     Head Circumference --      Peak Flow --      Pain Score 08/26/20 1702 8     Pain Loc --      Pain Edu? --      Excl. in GC? --    No data found.  Updated Vital Signs BP 08/28/20)  158/104 (BP Location: Right Arm) Comment: patient did not take BP medication today   Pulse 98    Temp 98.3 F (36.8 C) (Oral)    Resp 18    Ht 5\' 5"  (1.651 m)    Wt 260 lb (117.9 kg)    SpO2 100%    BMI 43.27 kg/m   Visual Acuity Right Eye Distance:   Left Eye Distance:   Bilateral Distance:    Right Eye Near:   Left Eye Near:    Bilateral Near:     Physical Exam Vitals and nursing note reviewed.  Constitutional:      General: She is not in acute distress.    Appearance: Normal appearance. She is not toxic-appearing.  HENT:     Head: Normocephalic and atraumatic.     Right Ear: Ear canal and external ear normal. There is no impacted cerumen.     Left Ear: Ear canal and external ear normal. There is no impacted cerumen.     Ears:     Comments: Bilateral TMs erythematous and  injected with loss of landmarks.  No effusion noted.    Nose: Nose normal.  Eyes:     General: No scleral icterus.    Extraocular Movements: Extraocular movements intact.     Conjunctiva/sclera: Conjunctivae normal.     Pupils: Pupils are equal, round, and reactive to light.  Cardiovascular:     Rate and Rhythm: Normal rate and regular rhythm.     Pulses: Normal pulses.     Heart sounds: Normal heart sounds. No murmur heard.  No gallop.   Pulmonary:     Effort: Pulmonary effort is normal.     Breath sounds: Normal breath sounds. No wheezing, rhonchi or rales.  Musculoskeletal:        General: No swelling or tenderness. Normal range of motion.  Skin:    General: Skin is warm and dry.     Capillary Refill: Capillary refill takes less than 2 seconds.     Findings: No erythema or rash.  Neurological:     General: No focal deficit present.     Mental Status: She is alert and oriented to person, place, and time.  Psychiatric:        Mood and Affect: Mood normal.        Behavior: Behavior normal.        Thought Content: Thought content normal.        Judgment: Judgment normal.      UC Treatments / Results  Labs (all labs ordered are listed, but only abnormal results are displayed) Labs Reviewed - No data to display  EKG   Radiology No results found.  Procedures Procedures (including critical care time)  Medications Ordered in UC Medications - No data to display  Initial Impression / Assessment and Plan / UC Course  I have reviewed the triage vital signs and the nursing notes.  Pertinent labs & imaging results that were available during my care of the patient were reviewed by me and considered in my medical decision making (see chart for details).   Is here for evaluation of right ear pain this been going on for the past week.  Patient has had some intermittent dizziness and ringing in her ears but nothing at present.  Bilateral TMs are erythematous and injected  consistent with otitis media.  Will treat with Augmentin twice daily x10 days.  Patient is also hypertensive in the clinic but reports that she did not take  her antihypertensive medication this morning.  Patient is not have any chest pain, nausea, vomiting, shortness of breath, or visual changes.   Final Clinical Impressions(s) / UC Diagnoses   Final diagnoses:  Non-recurrent acute suppurative otitis media of both ears without spontaneous rupture of tympanic membranes     Discharge Instructions     Go home and take your blood pressure medicine.  Take the Augmentin twice daily with food for 10 days.  Take an over-the-counter probiotic 1 hour after each dose of antibiotic to prevent diarrhea.  Use over-the-counter Tylenol and ibuprofen as needed for pain.  If you have a worsening of the dizziness, such as if you develop room spinning or vertigo symptoms, you need to return for reevaluation or go to the emergency department.    ED Prescriptions    Medication Sig Dispense Auth. Provider   amoxicillin-clavulanate (AUGMENTIN) 875-125 MG tablet Take 1 tablet by mouth every 12 (twelve) hours for 10 days. 20 tablet Becky Augusta, NP     PDMP not reviewed this encounter.   Becky Augusta, NP 08/26/20 1728

## 2020-09-06 ENCOUNTER — Other Ambulatory Visit: Payer: Self-pay

## 2020-09-06 ENCOUNTER — Ambulatory Visit
Admission: EM | Admit: 2020-09-06 | Discharge: 2020-09-06 | Disposition: A | Discharge status: Home / Self Care | Payer: 59 | Attending: Family Medicine | Admitting: Family Medicine

## 2020-09-06 DIAGNOSIS — H60503 Unspecified acute noninfective otitis externa, bilateral: Secondary | ICD-10-CM

## 2020-09-06 MED ORDER — CIPROFLOXACIN-DEXAMETHASONE 0.3-0.1 % OT SUSP: 4.0000 [drp] | Freq: Two times a day (BID) | OTIC | 0 refills | Status: AC

## 2020-09-06 NOTE — ED Triage Notes (Signed)
Patient complains of bilateral ear pain that started 2 weeks ago. States that she was seen last week and prescribed augmentin. Reports that she had resolution while on medication but is now having pain in both ears.

## 2020-09-06 NOTE — ED Provider Notes (Signed)
[facility]    CSN: [id number] Arrival date & time: 09/06/20  0820  History   Chief Complaint Chief Complaint  Patient presents with  . Otalgia   HPI  29 year old female presents with the above complaint.  Patient recently seen and diagnosed with otitis media.  Placed on Augmentin.  She finished her antibiotics yesterday.  Patient states that she never had complete resolution.  She had improvement but then her pain worsened again on Wednesday.  She is having pain in both ears.  No fever.  No other associated symptoms.  No other complaints.  Home Medications    Prior to Admission medications   Medication Sig Start Date End Date Taking? Authorizing Provider  hydrochlorothiazide (HYDRODIURIL) 25 MG tablet Take 25 mg by mouth daily.   Yes [provider]  ciprofloxacin-dexamethasone (CIPRODEX) OTIC suspension Place 4 drops into both ears 2 (two) times daily for 7 days. 09/06/20 09/13/20  Tommie Sams, DO  buPROPion (WELLBUTRIN SR) 150 MG 12 hr tablet Take 150 mg by mouth daily. 11/24/16 09/06/20  [provider]  sertraline (ZOLOFT) 100 MG tablet Take 100 mg by mouth daily. 11/24/16 08/26/20  [provider]    Family History Family History  Problem Relation Age of Onset  . Hypertension Father     Social History Social History   Tobacco Use  . Smoking status: Never Smoker  . Smokeless tobacco: Never Used  Substance Use Topics  . Alcohol use: No  . Drug use: No     Allergies   Latex   Review of Systems Review of Systems  HENT: Positive for ear pain.    Physical Exam Triage Vital Signs ED Triage Vitals  Enc Vitals Group     BP 09/06/20 0856 (!) 143/90     Pulse Rate 09/06/20 0856 82     Resp 09/06/20 0856 17     Temp 09/06/20 0856 98.4 F (36.9 C)     Temp Source 09/06/20 0856 Oral     SpO2 09/06/20 0856 100 %     Weight 09/06/20 0855 257 lb 15 oz (117 kg)     Height 09/06/20 0855 5\' 5"  (1.651 m)     Head Circumference  --      Peak Flow --      Pain Score 09/06/20 0855 6     Pain Loc --      Pain Edu? --      Excl. in GC? --    Updated Vital Signs BP (!) 143/90 (BP Location: Left Arm)   Pulse 82   Temp 98.4 F (36.9 C) (Oral)   Resp 17   Ht 5\' 5"  (1.651 m)   Wt 117 kg   SpO2 100%   BMI 42.92 kg/m   Visual Acuity Right Eye Distance:   Left Eye Distance:   Bilateral Distance:    Right Eye Near:   Left Eye Near:    Bilateral Near:     Physical Exam Constitutional:      Appearance: Normal appearance. She is obese.  HENT:     Head: Normocephalic and atraumatic.     Right Ear: Tympanic membrane normal.     Left Ear: Tympanic membrane normal.     Ears:     Comments: Canals with erythema bilaterally.  Mild tragal tenderness. Pulmonary:     Effort: Pulmonary effort is normal. No respiratory distress.  Neurological:     Mental Status: She is alert.  Psychiatric:  Mood and Affect: Mood normal.        Behavior: Behavior normal.    UC Treatments / Results  Labs (all labs ordered are listed, but only abnormal results are displayed) Labs Reviewed - No data to display  EKG   Radiology No results found.  Procedures Procedures (including critical care time)  Medications Ordered in UC Medications - No data to display  Initial Impression / Assessment and Plan / UC Course  I have reviewed the triage vital signs and the nursing notes.  Pertinent labs & imaging results that were available during my care of the patient were reviewed by me and considered in my medical decision making (see chart for details).    29 year old female presents with persistent/worsening ear pain.  TMs are normal.  Patient having tragal tenderness.  Mild erythema of the canal.  Treating for otitis externa with Ciprodex.  Final Clinical Impressions(s) / UC Diagnoses   Final diagnoses:  Acute otitis externa of both ears, unspecified type   Discharge Instructions   None    ED Prescriptions     Medication Sig Dispense Auth. Provider   ciprofloxacin-dexamethasone (CIPRODEX) OTIC suspension Place 4 drops into both ears 2 (two) times daily for 7 days. 7.5 mL Tommie Sams, DO     PDMP not reviewed this encounter.   Everlene Other [place], [place] 09/06/20 [phone]

## 2020-10-13 ENCOUNTER — Other Ambulatory Visit: Payer: Self-pay

## 2020-10-13 ENCOUNTER — Ambulatory Visit: Admission: EM | Admit: 2020-10-13 | Discharge: 2020-10-13 | Discharge status: Against Medical Advice | Payer: 59

## 2020-10-14 ENCOUNTER — Other Ambulatory Visit: Payer: Self-pay

## 2020-10-14 ENCOUNTER — Ambulatory Visit
Admission: EM | Admit: 2020-10-14 | Discharge: 2020-10-14 | Disposition: A | Discharge status: Home / Self Care | Payer: 59 | Attending: Sports Medicine | Admitting: Sports Medicine

## 2020-10-14 DIAGNOSIS — R52 Pain, unspecified: Secondary | ICD-10-CM | POA: Diagnosis not present

## 2020-10-14 DIAGNOSIS — Z20822 Contact with and (suspected) exposure to covid-19: Secondary | ICD-10-CM | POA: Insufficient documentation

## 2020-10-14 DIAGNOSIS — R519 Headache, unspecified: Secondary | ICD-10-CM | POA: Insufficient documentation

## 2020-10-14 DIAGNOSIS — Z1152 Encounter for screening for COVID-19: Secondary | ICD-10-CM | POA: Diagnosis not present

## 2020-10-14 DIAGNOSIS — J029 Acute pharyngitis, unspecified: Secondary | ICD-10-CM | POA: Insufficient documentation

## 2020-10-14 HISTORY — DX: Essential (primary) hypertension: I10

## 2020-10-14 LAB — SARS CORONAVIRUS 2 (TAT 6-24 HRS): SARS Coronavirus 2: NEGATIVE

## 2020-10-14 NOTE — Discharge Instructions (Addendum)
Recommended going ahead and getting a COVID test.  We will send that off to the hospital as we do not have any rapid test.  I will take between 6 and 24 hours to come back.  In the interim she has to assume that she is positive and isolate accordingly.  If she is positive she will need an updated work note.  If she is negative I will give her a work note that she can return to work on January 14 if she is asymptomatic.  She needs to follow the current CDC guidelines. Supportive care, over-the-counter meds as needed, Motrin or Tylenol for fever or discomfort.  Plenty of rest plenty of fluids. Red flag signs and symptoms were discussed in detail and when to seek out immediate medical attention.  She voiced verbal understanding. For now we will discharge her and she will follow-up with [place] as needed.

## 2020-10-14 NOTE — ED Provider Notes (Signed)
[facility]    CSN: [id number] Arrival date & time: 10/14/20  0846      History   Chief Complaint Chief Complaint  Patient presents with  . Generalized Body Aches    HPI Ann Huber is a 30 y.o. female.   Pleasant 30 year old female who presents for evaluation of 4 days of headaches body aches.  She also reports loose stools and sore throat for the last 2 days.  She has had COVID exposure but no influenza exposure that she knows of.  She works in a [employer] and has had 4 employees out last week with a positive COVID test.  She denies any abdominal pain or urinary symptoms.  She has been vaccinated x2 but no booster.  She has not gotten the flu vaccine.  No fever shakes chills, no nausea or vomiting but she has had those loose stools.  No chest pain or shortness of breath.  No respiratory symptoms to speak of.  No red flag signs or symptoms elicited on history     Past Medical History:  Diagnosis Date  . Hypertension     There are no problems to display for this patient.   Past Surgical History:  Procedure Laterality Date  . CHOLECYSTECTOMY    . Tubes Tied      OB History   No obstetric history on file.      Home Medications    Prior to Admission medications   Medication Sig Start Date End Date Taking? Authorizing Provider  hydrochlorothiazide (HYDRODIURIL) 25 MG tablet Take 25 mg by mouth daily.    [provider]  buPROPion (WELLBUTRIN SR) 150 MG 12 hr tablet Take 150 mg by mouth daily. 11/24/16 09/06/20  [provider]  sertraline (ZOLOFT) 100 MG tablet Take 100 mg by mouth daily. 11/24/16 08/26/20  [provider]    Family History Family History  Problem Relation Age of Onset  . Hypertension Father     Social History Social History   Tobacco Use  . Smoking status: Never Smoker  . Smokeless tobacco: Never Used  Substance Use Topics  . Alcohol use: No  . Drug use: No     Allergies    Latex   Review of Systems Review of Systems  Constitutional: Negative for chills, fatigue and fever.  HENT: Positive for rhinorrhea and sore throat. Negative for congestion, ear discharge, ear pain, postnasal drip, sinus pressure and sinus pain.   Eyes: Negative for pain.  Respiratory: Negative for cough, chest tightness, shortness of breath, wheezing and stridor.   Cardiovascular: Negative for chest pain and palpitations.  Gastrointestinal: Negative for abdominal pain.  Genitourinary: Negative for dysuria.  Musculoskeletal: Positive for myalgias.  Neurological: Positive for headaches.  All other systems reviewed and are negative.    Physical Exam Triage Vital Signs ED Triage Vitals  Enc Vitals Group     BP 10/14/20 0952 (!) 131/96     Pulse Rate 10/14/20 0952 78     Resp 10/14/20 0952 16     Temp 10/14/20 0952 98.3 F (36.8 C)     Temp Source 10/14/20 0952 Oral     SpO2 10/14/20 0952 100 %     Weight 10/14/20 0949 262 lb (118.8 kg)     Height --      Head Circumference --      Peak Flow --      Pain Score 10/14/20 0949 0     Pain Loc --  Pain Edu? --      Excl. in GC? --    No data found.  Updated Vital Signs BP (!) 131/96 (BP Location: Left Arm)   Pulse 78   Temp 98.3 F (36.8 C) (Oral)   Resp 16   Wt 118.8 kg   SpO2 100%   BMI 43.60 kg/m   Visual Acuity Right Eye Distance:   Left Eye Distance:   Bilateral Distance:    Right Eye Near:   Left Eye Near:    Bilateral Near:     Physical Exam Vitals and nursing note reviewed.  Constitutional:      General: She is not in acute distress.    Appearance: Normal appearance. She is not ill-appearing, toxic-appearing or diaphoretic.  HENT:     Head: Normocephalic and atraumatic.     Right Ear: Tympanic membrane normal.     Left Ear: Tympanic membrane normal.     Nose: Nose normal. No congestion or rhinorrhea.     Mouth/Throat:     Mouth: Mucous membranes are moist.     Pharynx: Posterior  oropharyngeal erythema present. No oropharyngeal exudate.  Eyes:     Extraocular Movements: Extraocular movements intact.     Conjunctiva/sclera: Conjunctivae normal.     Pupils: Pupils are equal, round, and reactive to light.  Neck:     Vascular: No carotid bruit.  Cardiovascular:     Rate and Rhythm: Normal rate and regular rhythm.     Pulses: Normal pulses.     Heart sounds: Normal heart sounds. No murmur heard. No friction rub. No gallop.   Pulmonary:     Effort: Pulmonary effort is normal. No respiratory distress.     Breath sounds: Normal breath sounds. No stridor. No wheezing, rhonchi or rales.  Musculoskeletal:     Cervical back: Normal range of motion and neck supple. No rigidity or tenderness.  Lymphadenopathy:     Cervical: Cervical adenopathy present.  Skin:    General: Skin is warm and dry.     Capillary Refill: Capillary refill takes less than 2 seconds.  Neurological:     General: No focal deficit present.     Mental Status: She is alert and oriented to person, place, and time.  Psychiatric:        Mood and Affect: Mood normal.        Behavior: Behavior normal.      UC Treatments / Results  Labs (all labs ordered are listed, but only abnormal results are displayed) Labs Reviewed  SARS CORONAVIRUS 2 (TAT 6-24 HRS)    EKG   Radiology No results found.  Procedures Procedures (including critical care time)  Medications Ordered in UC Medications - No data to display  Initial Impression / Assessment and Plan / UC Course  I have reviewed the triage vital signs and the nursing notes.  Pertinent labs & imaging results that were available during my care of the patient were reviewed by me and considered in my medical decision making (see chart for details).  Clinical impression: 30 year old female with 4 days of headache and body aches.  Now she has 2 days of sore throat and loose stools.  Concerning that she may have COVID as she has been  exposed.  Treatment plan: 1.  The findings and treatment plan were discussed in detail with the patient.  Patient was in agreement. 2.  Recommended going ahead and getting a COVID test.  We will send that off to the hospital as  we do not have any rapid test.  I will take between 6 and 24 hours to come back.  In the interim she has to assume that she is positive and isolate accordingly.  If she is positive she will need an updated work note.  If she is negative I will give her a work note that she can return to work on January 14 if she is asymptomatic.  She needs to follow the current CDC guidelines. 3.  Supportive care, over-the-counter meds as needed, Motrin or Tylenol for fever or discomfort.  Plenty of rest plenty of fluids. 4.  Red flag signs and symptoms were discussed in detail and when to seek out immediate medical attention.  She voiced verbal understanding. 5.  For now we will discharge her and she will follow-up with [place] as needed.    Final Clinical Impressions(s) / UC Diagnoses   Final diagnoses:  Encounter for screening for COVID-19  Body aches  Acute nonintractable headache, unspecified headache type  Sore throat  Close exposure to COVID-19 virus     Discharge Instructions     Recommended going ahead and getting a COVID test.  We will send that off to the hospital as we do not have any rapid test.  I will take between 6 and 24 hours to come back.  In the interim she has to assume that she is positive and isolate accordingly.  If she is positive she will need an updated work note.  If she is negative I will give her a work note that she can return to work on January 14 if she is asymptomatic.  She needs to follow the current CDC guidelines. Supportive care, over-the-counter meds as needed, Motrin or Tylenol for fever or discomfort.  Plenty of rest plenty of fluids. Red flag signs and symptoms were discussed in detail and when to seek out immediate medical attention.  She voiced  verbal understanding. For now we will discharge her and she will follow-up with [place] as needed.    ED Prescriptions    None     PDMP not reviewed this encounter.   Delton See, MD 10/14/20 1102

## 2020-10-14 NOTE — ED Triage Notes (Signed)
Patient presents to Urgent Care with complaints of body aches since Saturday night. Patient reports low-grade fever started Monday. Treating symptoms with Tylenol and OTC cold meds.

## 2020-10-19 ENCOUNTER — Encounter: Payer: Self-pay | Admitting: Emergency Medicine

## 2020-10-19 ENCOUNTER — Ambulatory Visit: Payer: Self-pay

## 2020-10-19 ENCOUNTER — Other Ambulatory Visit: Payer: Self-pay

## 2020-10-19 ENCOUNTER — Ambulatory Visit
Admission: EM | Admit: 2020-10-19 | Discharge: 2020-10-19 | Disposition: A | Discharge status: Home / Self Care | Payer: 59 | Attending: Physician Assistant | Admitting: Physician Assistant

## 2020-10-19 DIAGNOSIS — M791 Myalgia, unspecified site: Secondary | ICD-10-CM

## 2020-10-19 DIAGNOSIS — B349 Viral infection, unspecified: Secondary | ICD-10-CM | POA: Diagnosis present

## 2020-10-19 DIAGNOSIS — Z20822 Contact with and (suspected) exposure to covid-19: Secondary | ICD-10-CM | POA: Diagnosis present

## 2020-10-19 DIAGNOSIS — R059 Cough, unspecified: Secondary | ICD-10-CM | POA: Diagnosis present

## 2020-10-19 DIAGNOSIS — J029 Acute pharyngitis, unspecified: Secondary | ICD-10-CM | POA: Diagnosis present

## 2020-10-19 DIAGNOSIS — U071 COVID-19: Secondary | ICD-10-CM | POA: Insufficient documentation

## 2020-10-19 MED ORDER — IPRATROPIUM BROMIDE 0.06 % NA SOLN: 2.0000 | Freq: Four times a day (QID) | NASAL | 12 refills | Status: AC

## 2020-10-19 MED ORDER — IBUPROFEN 800 MG PO TABS: 800.0000 mg | ORAL_TABLET | Freq: Three times a day (TID) | ORAL | 0 refills | Status: AC | PRN

## 2020-10-19 MED ORDER — LIDOCAINE VISCOUS HCL 2 % MT SOLN: 5.0000 mL | OROMUCOSAL | 0 refills | Status: AC | PRN

## 2020-10-19 MED ORDER — BENZONATATE 100 MG PO CAPS: 200.0000 mg | ORAL_CAPSULE | Freq: Three times a day (TID) | ORAL | 0 refills | Status: AC | PRN

## 2020-10-19 NOTE — ED Triage Notes (Signed)
Pt c/o cough, congestion, fever (101), body aches headache, and chills. Started about 4 days ago. She had a covid test at [facility] last week and was negative.

## 2020-10-19 NOTE — ED Provider Notes (Signed)
[facility]    CSN: [id number] Arrival date & time: 10/19/20  1214      History   Chief Complaint Chief Complaint  Patient presents with  . Cough  . Fever    HPI Ann Huber is a 30 y.o. female presenting for 5-day history of cough, congestion, sore throat.  She also admits to body aches, headaches and chills.  She says she had fever up to 101 degrees starting 3 days ago and has had persistent fever.  Has been taking over-the-counter Tylenol for control of fever.  Last dose was sometime this morning.  Patient has been exposed to COVID-19 through multiple coworkers.  She was seen in our clinic last week, about 5 days ago and tested negative for COVID-19 at that time.  She has been fully vaccinated for COVID-19.  She denies any associated sinus pain, chest pain, breathing difficulty, abdominal pain, nausea/vomiting or diarrhea.  Denies any smell or taste changes.  Patient's past medical history significant for hypertension but no cardiopulmonary disease.  She has no other complaints or concerns.  HPI  Past Medical History:  Diagnosis Date  . Hypertension     There are no problems to display for this patient.   Past Surgical History:  Procedure Laterality Date  . CHOLECYSTECTOMY    . Tubes Tied      OB History   No obstetric history on file.      Home Medications    Prior to Admission medications   Medication Sig Start Date End Date Taking? Authorizing Provider  benzonatate (TESSALON) 100 MG capsule Take 2 capsules (200 mg total) by mouth 3 (three) times daily as needed for up to 7 days for cough. 10/19/20 10/26/20 Yes Shirlee Latch, PA-C  hydrochlorothiazide (HYDRODIURIL) 25 MG tablet Take 25 mg by mouth daily.   Yes [provider]  ibuprofen (ADVIL) 800 MG tablet Take 1 tablet (800 mg total) by mouth every 8 (eight) hours as needed for up to 7 days for fever, headache or moderate pain. 10/19/20 10/26/20 Yes Eusebio Friendly B, PA-C  ipratropium  (ATROVENT) 0.06 % nasal spray Place 2 sprays into both nostrils 4 (four) times daily. 10/19/20  Yes Eusebio Friendly B, PA-C  lidocaine (XYLOCAINE) 2 % solution Use as directed 5 mLs in the mouth or throat as needed for up to 5 days for mouth pain (q2h). 10/19/20 10/24/20 Yes Shirlee Latch, PA-C  buPROPion (WELLBUTRIN SR) 150 MG 12 hr tablet Take 150 mg by mouth daily. 11/24/16 09/06/20  [provider]  sertraline (ZOLOFT) 100 MG tablet Take 100 mg by mouth daily. 11/24/16 08/26/20  [provider]    Family History Family History  Problem Relation Age of Onset  . Hypertension Father     Social History Social History   Tobacco Use  . Smoking status: Never Smoker  . Smokeless tobacco: Never Used  Substance Use Topics  . Alcohol use: No  . Drug use: No     Allergies   Latex   Review of Systems Review of Systems  Constitutional: Positive for fatigue and fever. Negative for chills and diaphoresis.  HENT: Positive for congestion, rhinorrhea and sore throat. Negative for ear pain, sinus pressure and sinus pain.   Respiratory: Positive for cough. Negative for shortness of breath.   Gastrointestinal: Negative for abdominal pain, nausea and vomiting.  Musculoskeletal: Positive for myalgias. Negative for arthralgias.  Skin: Negative for rash.  Neurological: Negative for weakness and headaches.  Hematological:  Negative for adenopathy.     Physical Exam Triage Vital Signs ED Triage Vitals  Enc Vitals Group     BP 10/19/20 1224 (!) 154/106     Pulse Rate 10/19/20 1224 (!) 112     Resp 10/19/20 1224 18     Temp 10/19/20 1224 98.6 F (37 C)     Temp Source 10/19/20 1224 Oral     SpO2 10/19/20 1224 100 %     Weight 10/19/20 1223 261 lb 14.5 oz (118.8 kg)     Height 10/19/20 1223 5\' 5"  (1.651 m)     Head Circumference --      Peak Flow --      Pain Score 10/19/20 1223 3     Pain Loc --      Pain Edu? --      Excl. in GC? --    No data found.  Updated Vital  Signs BP (!) 154/106 (BP Location: Left Arm)   Pulse (!) 112   Temp 98.6 F (37 C) (Oral)   Resp 18   Ht 5\' 5"  (1.651 m)   Wt 261 lb 14.5 oz (118.8 kg)   SpO2 100%   BMI 43.58 kg/m       Physical Exam Vitals and nursing note reviewed.  Constitutional:      General: She is not in acute distress.    Appearance: Normal appearance. She is obese. She is ill-appearing. She is not toxic-appearing.  HENT:     Head: Normocephalic and atraumatic.     Right Ear: Tympanic membrane, ear canal and external ear normal.     Left Ear: Tympanic membrane, ear canal and external ear normal.     Nose: Congestion and rhinorrhea present.     Mouth/Throat:     Mouth: Mucous membranes are moist.     Pharynx: Oropharynx is clear. Posterior oropharyngeal erythema present.  Eyes:     General: No scleral icterus.       Right eye: No discharge.        Left eye: No discharge.     Conjunctiva/sclera: Conjunctivae normal.  Cardiovascular:     Rate and Rhythm: Regular rhythm. Tachycardia present.     Heart sounds: Normal heart sounds.  Pulmonary:     Effort: Pulmonary effort is normal. No respiratory distress.     Breath sounds: Normal breath sounds. No wheezing, rhonchi or rales.  Musculoskeletal:     Cervical back: Neck supple.  Skin:    General: Skin is dry.  Neurological:     General: No focal deficit present.     Mental Status: She is alert. Mental status is at baseline.     Motor: No weakness.     Gait: Gait normal.  Psychiatric:        Mood and Affect: Mood normal.        Behavior: Behavior normal.        Thought Content: Thought content normal.      UC Treatments / Results  Labs (all labs ordered are listed, but only abnormal results are displayed) Labs Reviewed  SARS CORONAVIRUS 2 (TAT 6-24 HRS)    EKG   Radiology No results found.  Procedures Procedures (including critical care time)  Medications Ordered in UC Medications - No data to display  Initial Impression /  Assessment and Plan / UC Course  I have reviewed the triage vital signs and the nursing notes.  Pertinent labs & imaging results that were available during my care  of the patient were reviewed by me and considered in my medical decision making (see chart for details).   30 year old female with positive COVID-19 exposure presenting for 5-day history of cough, congestion, sore throat, body aches and new onset fever over the past 3 days.  She did have a negative COVID-19 test at our clinic a day symptoms began.  Patient's not been tested since.  She is in no acute distress.  Her pulse is a little elevated at 112 bpm and blood pressure also elevated at 154/106.  Her chest is clear to auscultation and heart has regular rhythm.  She does have mild nasal congestion and posterior pharyngeal erythema on exam.  She is ill-appearing but nontoxic.  Patient response for COVID-19.  Advised her that it may have been too soon to be tested initially but given that she has been exposed, retesting her today.  Advised her that she needs to be out of work until the fever resolves which may be over the next 3 days so I provided her a work note.  Advised supportive care with increasing rest of fluids.  Also sent Tessalon Perles, Atrovent nasal spray, viscous lidocaine, and ibuprofen to the pharmacy to help with symptoms.  Advised her to follow-up with her clinic for any new or worsening symptoms.  Final Clinical Impressions(s) / UC Diagnoses   Final diagnoses:  Viral illness  Cough  Sore throat  Myalgia  Exposure to COVID-19 virus     Discharge Instructions     You have received COVID testing today either for positive exposure, concerning symptoms that could be related to COVID infection, screening purposes, or re-testing after confirmed positive.  Your test obtained today checks for active viral infection in the last 1-2 weeks. If your test is negative now, you can still test positive later. So, if you do develop  symptoms you should either get re-tested and/or isolate x 5 days and then strict mask use x 5 days (unvaccinated) or mask use x 10 days (vaccinated). Please follow CDC guidelines.  While Rapid antigen tests come back in 15-20 minutes, send out PCR/molecular test results typically come back within 1-3 days. In the mean time, if you are symptomatic, assume this could be a positive test and treat/monitor yourself as if you do have COVID.   We will call with test results if positive. Please download the MyChart app and set up a profile to access test results.   If symptomatic, go home and rest. Push fluids. Take Tylenol as needed for discomfort. Gargle warm salt water. Throat lozenges. Take Mucinex DM or Robitussin for cough. Humidifier in bedroom to ease coughing. Warm showers. Also review the COVID handout for more information.  COVID-19 INFECTION: The incubation period of COVID-19 is approximately 14 days after exposure, with most symptoms developing in roughly 4-5 days. Symptoms may range in severity from mild to critically severe. Roughly 80% of those infected will have mild symptoms. People of any age may become infected with COVID-19 and have the ability to transmit the virus. The most common symptoms include: fever, fatigue, cough, body aches, headaches, sore throat, nasal congestion, shortness of breath, nausea, vomiting, diarrhea, changes in smell and/or taste.    COURSE OF ILLNESS Some patients may begin with mild disease which can progress quickly into critical symptoms. If your symptoms are worsening please call ahead to the Emergency Department and proceed there for further treatment. Recovery time appears to be roughly 1-2 weeks for mild symptoms and 3-6 weeks for  severe disease.   GO IMMEDIATELY TO ER FOR FEVER YOU ARE UNABLE TO GET DOWN WITH TYLENOL, BREATHING PROBLEMS, CHEST PAIN, FATIGUE, LETHARGY, INABILITY TO EAT OR DRINK, ETC  QUARANTINE AND ISOLATION: To help decrease the spread of  COVID-19 please remain isolated if you have COVID infection or are highly suspected to have COVID infection. This means -stay home and isolate to one room in the home if you live with others. Do not share a bed or bathroom with others while ill, sanitize and wipe down all countertops and keep common areas clean and disinfected. Stay home for 5 days. If you have no symptoms or your symptoms are resolving after 5 days, you can leave your house. Continue to wear a mask around others for 5 additional days. If you have been in close contact (within 6 feet) of someone diagnosed with COVID 19, you are advised to quarantine in your home for 14 days as symptoms can develop anywhere from 2-14 days after exposure to the virus. If you develop symptoms, you  must isolate.  Most current guidelines for COVID after exposure -unvaccinated: isolate 5 days and strict mask use x 5 days. Test on day 5 is possible -vaccinated: wear mask x 10 days if symptoms do not develop -You do not necessarily need to be tested for COVID if you have + exposure and  develop symptoms. Just isolate at home x10 days from symptom onset During this global pandemic, CDC advises to practice social distancing, try to stay at least 58ft away from others at all times. Wear a face covering. Wash and sanitize your hands regularly and avoid going anywhere that is not necessary.  KEEP IN MIND THAT THE COVID TEST IS NOT 100% ACCURATE AND YOU SHOULD STILL DO EVERYTHING TO PREVENT POTENTIAL SPREAD OF VIRUS TO OTHERS (WEAR MASK, WEAR GLOVES, WASH HANDS AND SANITIZE REGULARLY). IF INITIAL TEST IS NEGATIVE, THIS MAY NOT MEAN YOU ARE DEFINITELY NEGATIVE. MOST ACCURATE TESTING IS DONE 5-7 DAYS AFTER EXPOSURE.   It is not advised by CDC to get re-tested after receiving a positive COVID test since you can still test positive for weeks to months after you have already cleared the virus.   *If you have not been vaccinated for COVID, I strongly suggest you consider  getting vaccinated as long as there are no contraindications.      ED Prescriptions    Medication Sig Dispense Auth. Provider   benzonatate (TESSALON) 100 MG capsule Take 2 capsules (200 mg total) by mouth 3 (three) times daily as needed for up to 7 days for cough. 21 capsule Eusebio Friendly B, PA-C   ipratropium (ATROVENT) 0.06 % nasal spray Place 2 sprays into both nostrils 4 (four) times daily. 15 mL Eusebio Friendly B, PA-C   lidocaine (XYLOCAINE) 2 % solution Use as directed 5 mLs in the mouth or throat as needed for up to 5 days for mouth pain (q2h). 100 mL Eusebio Friendly B, PA-C   ibuprofen (ADVIL) 800 MG tablet Take 1 tablet (800 mg total) by mouth every 8 (eight) hours as needed for up to 7 days for fever, headache or moderate pain. 21 tablet Shirlee Latch, PA-C     PDMP not reviewed this encounter.   Shirlee Latch, PA-C 10/19/20 1312

## 2020-10-19 NOTE — Discharge Instructions (Addendum)

## 2020-10-20 LAB — SARS CORONAVIRUS 2 (TAT 6-24 HRS): SARS Coronavirus 2: POSITIVE — AB

## 2020-10-21 ENCOUNTER — Telehealth: Payer: Self-pay

## 2020-10-21 NOTE — Telephone Encounter (Signed)
Called to discuss with patient about COVID-19 symptoms and the use of one of the available treatments for those with mild to moderate Covid symptoms and at a high risk of hospitalization.  Pt appears to qualify for outpatient treatment due to co-morbid conditions and/or a member of an at-risk group in accordance with the FDA Emergency Use Authorization.    Symptom onset: 10/14/20 per chart Vaccinated: Yes Booster? Unknown Immunocompromised? No Qualifiers: HTN  Unable to reach pt - Voice mailbox is full. Pt. Is outside the treatment window.   Ann Huber

## 2020-10-29 ENCOUNTER — Other Ambulatory Visit: Payer: Self-pay

## 2020-10-29 ENCOUNTER — Ambulatory Visit
Admission: RE | Admit: 2020-10-29 | Discharge: 2020-10-29 | Disposition: A | Discharge status: Home / Self Care | Payer: 59 | Source: Ambulatory Visit | Attending: Physician Assistant | Admitting: Physician Assistant

## 2020-10-29 VITALS — BP 150/91 | HR 85 | Temp 98.7°F | Resp 18 | Ht 65.0 in | Wt 261.9 lb

## 2020-10-29 DIAGNOSIS — H6981 Other specified disorders of Eustachian tube, right ear: Secondary | ICD-10-CM

## 2020-10-29 DIAGNOSIS — H6691 Otitis media, unspecified, right ear: Secondary | ICD-10-CM

## 2020-10-29 DIAGNOSIS — H9201 Otalgia, right ear: Secondary | ICD-10-CM | POA: Diagnosis not present

## 2020-10-29 DIAGNOSIS — R42 Dizziness and giddiness: Secondary | ICD-10-CM | POA: Diagnosis not present

## 2020-10-29 MED ORDER — CEFDINIR 300 MG PO CAPS: 300.0000 mg | ORAL_CAPSULE | Freq: Two times a day (BID) | ORAL | 0 refills | Status: AC

## 2020-10-29 MED ORDER — MECLIZINE HCL 25 MG PO TABS: 25.0000 mg | ORAL_TABLET | Freq: Three times a day (TID) | ORAL | 0 refills | Status: AC | PRN

## 2020-10-29 NOTE — ED Triage Notes (Addendum)
Pt c/o right ear pain, dizziness and nausea. Started yesterday. Denies fever. Pt had covid about 10 days ago.

## 2020-10-29 NOTE — ED Provider Notes (Signed)
[facility]    CSN: [id number] Arrival date & time: 10/29/20  1048      History   Chief Complaint Chief Complaint  Patient presents with  . Appointment  . Otalgia    Right     HPI Ann Huber is a 30 y.o. female presenting for right ear pain, dizziness, and nausea starting yesterday.  Patient was seen in the clinic by me on 10/19/2020 and tested positive for COVID at that time. She was symptomatic for 5 days before that.  Patient states that most of her Covid symptoms have resolved other than a lingering mild cough that is dry.  She says that she has felt nearly back to normal until her ear pain and dizziness started yesterday.  Dizziness is worse with position changes.  She says that her ear pain is constant and aching.  Denies any drainage from the ear.  Pain is apparently worse when she lays on the right side.  She denies any fevers, fatigue, body aches.  She denies any sinus pain or congestion.  No chest pain or breathing difficulty.  She says she did vomit once when she got really dizzy and nauseous.  She has been using Atrovent nasal spray as prescribed at last visit, but no other medications for symptoms.  She has no other complaints or concerns.  HPI  Past Medical History:  Diagnosis Date  . Hypertension     There are no problems to display for this patient.   Past Surgical History:  Procedure Laterality Date  . CHOLECYSTECTOMY    . Tubes Tied      OB History   No obstetric history on file.      Home Medications    Prior to Admission medications   Medication Sig Start Date End Date Taking? Authorizing Provider  cefdinir (OMNICEF) 300 MG capsule Take 1 capsule (300 mg total) by mouth 2 (two) times daily for 10 days. 10/29/20 11/08/20 Yes Shirlee Latch, PA-C  hydrochlorothiazide (HYDRODIURIL) 25 MG tablet Take 25 mg by mouth daily.   Yes [provider]  meclizine (ANTIVERT) 25 MG tablet Take 1 tablet (25 mg total) by mouth 3 (three)  times daily as needed for up to 10 days for dizziness. 10/29/20 11/08/20 Yes Eusebio Friendly B, PA-C  ipratropium (ATROVENT) 0.06 % nasal spray Place 2 sprays into both nostrils 4 (four) times daily. 10/19/20   Shirlee Latch, PA-C  buPROPion (WELLBUTRIN SR) 150 MG 12 hr tablet Take 150 mg by mouth daily. 11/24/16 09/06/20  [provider]  sertraline (ZOLOFT) 100 MG tablet Take 100 mg by mouth daily. 11/24/16 08/26/20  [provider]    Family History Family History  Problem Relation Age of Onset  . Hypertension Father     Social History Social History   Tobacco Use  . Smoking status: Never Smoker  . Smokeless tobacco: Never Used  Substance Use Topics  . Alcohol use: No  . Drug use: No     Allergies   Latex   Review of Systems Review of Systems  Constitutional: Negative for chills, diaphoresis, fatigue and fever.  HENT: Positive for ear pain. Negative for congestion, rhinorrhea, sinus pressure, sinus pain and sore throat.   Respiratory: Positive for cough. Negative for shortness of breath.   Gastrointestinal: Positive for nausea and vomiting (x 1). Negative for abdominal pain.  Musculoskeletal: Negative for arthralgias and myalgias.  Skin: Negative for rash.  Neurological: Positive for dizziness. Negative for weakness and  headaches.  Hematological: Negative for adenopathy.     Physical Exam Triage Vital Signs ED Triage Vitals  Enc Vitals Group     BP 10/29/20 1111 (!) 150/91     Pulse Rate 10/29/20 1111 85     Resp 10/29/20 1111 18     Temp 10/29/20 1111 98.7 F (37.1 C)     Temp Source 10/29/20 1111 Oral     SpO2 10/29/20 1111 100 %     Weight 10/29/20 1109 261 lb 14.5 oz (118.8 kg)     Height 10/29/20 1109 5\' 5"  (1.651 m)     Head Circumference --      Peak Flow --      Pain Score 10/29/20 1109 6     Pain Loc --      Pain Edu? --      Excl. in GC? --    No data found.  Updated Vital Signs BP (!) 150/91 (BP Location: Left Arm)   Pulse 85    Temp 98.7 F (37.1 C) (Oral)   Resp 18   Ht 5\' 5"  (1.651 m)   Wt 261 lb 14.5 oz (118.8 kg)   SpO2 100%   BMI 43.58 kg/m       Physical Exam Vitals and nursing note reviewed.  Constitutional:      General: She is not in acute distress.    Appearance: Normal appearance. She is not ill-appearing or toxic-appearing.  HENT:     Head: Normocephalic and atraumatic.     Right Ear: Hearing, ear canal and external ear normal. Tympanic membrane is erythematous (moderate) and retracted.     Left Ear: Hearing, ear canal and external ear normal. Tympanic membrane is erythematous (mild).     Nose: Nose normal.     Mouth/Throat:     Mouth: Mucous membranes are moist.     Pharynx: Oropharynx is clear.  Eyes:     General: No scleral icterus.       Right eye: No discharge.        Left eye: No discharge.     Conjunctiva/sclera: Conjunctivae normal.  Cardiovascular:     Rate and Rhythm: Normal rate and regular rhythm.     Heart sounds: Normal heart sounds.  Pulmonary:     Effort: Pulmonary effort is normal. No respiratory distress.     Breath sounds: Normal breath sounds.  Musculoskeletal:     Cervical back: Neck supple.  Skin:    General: Skin is dry.  Neurological:     General: No focal deficit present.     Mental Status: She is alert. Mental status is at baseline.     Motor: No weakness.     Gait: Gait normal.  Psychiatric:        Mood and Affect: Mood normal.        Behavior: Behavior normal.        Thought Content: Thought content normal.      UC Treatments / Results  Labs (all labs ordered are listed, but only abnormal results are displayed) Labs Reviewed - No data to display  EKG   Radiology No results found.  Procedures Procedures (including critical care time)  Medications Ordered in UC Medications - No data to display  Initial Impression / Assessment and Plan / UC Course  I have reviewed the triage vital signs and the nursing notes.  Pertinent labs &  imaging results that were available during my care of the patient were reviewed by me  and considered in my medical decision making (see chart for details).   Exam and patient symptoms consistent with right otitis media and eustachian tube dysfunction.  Treating at this time with cefdinir, Atrovent nasal spray, and meclizine.  Encouraged nasal saline and sinus rinses.  Advised following up with our department as needed for any worsening symptoms.  Explained to her that treating fluid behind the ear is difficult and sometimes takes some time.  Advised for her to follow-up with [place] if she develops a fever, worsening ear pain or worsening dizziness or headaches.  She is agreeable.  Work note provided.   Final Clinical Impressions(s) / UC Diagnoses   Final diagnoses:  Right otitis media, unspecified otitis media type  Right ear pain  Dysfunction of right eustachian tube  Dizziness     Discharge Instructions     Your exam is consistent with fluid and infection behind the eardrum. Continue the Atrovent nasal spray. I have sent meclizine for the dizziness. Dizziness is related to eustachian tube dysfunction from the fluid behind the ears. I have sent cefdinir antibiotic for the infection. As discussed, sometimes fluid behind the ear is difficult to treat. If you symptoms worsen, you should be seen again, otherwise continue with treatment here today. Consider following up with ENT if this is recurrent problem.    ED Prescriptions    Medication Sig Dispense Auth. Provider   meclizine (ANTIVERT) 25 MG tablet Take 1 tablet (25 mg total) by mouth 3 (three) times daily as needed for up to 10 days for dizziness. 30 tablet Eusebio Friendly B, PA-C   cefdinir (OMNICEF) 300 MG capsule Take 1 capsule (300 mg total) by mouth 2 (two) times daily for 10 days. 20 capsule Shirlee Latch, PA-C     PDMP not reviewed this encounter.   Shirlee Latch, PA-C 10/29/20 1134

## 2020-10-29 NOTE — Discharge Instructions (Signed)
Your exam is consistent with fluid and infection behind the eardrum. Continue the Atrovent nasal spray. I have sent meclizine for the dizziness. Dizziness is related to eustachian tube dysfunction from the fluid behind the ears. I have sent cefdinir antibiotic for the infection. As discussed, sometimes fluid behind the ear is difficult to treat. If you symptoms worsen, you should be seen again, otherwise continue with treatment here today. Consider following up with ENT if this is recurrent problem.

## 2021-01-16 ENCOUNTER — Other Ambulatory Visit: Payer: Self-pay

## 2021-01-16 ENCOUNTER — Encounter: Payer: Self-pay | Admitting: Emergency Medicine

## 2021-01-16 ENCOUNTER — Ambulatory Visit
Admission: EM | Admit: 2021-01-16 | Discharge: 2021-01-16 | Disposition: A | Discharge status: Home / Self Care | Payer: 59 | Attending: Emergency Medicine | Admitting: Emergency Medicine

## 2021-01-16 DIAGNOSIS — H6982 Other specified disorders of Eustachian tube, left ear: Secondary | ICD-10-CM

## 2021-01-16 MED ORDER — PREDNISONE 10 MG (21) PO TBPK: ORAL_TABLET | ORAL | 0 refills | Status: AC

## 2021-01-16 MED ORDER — FLUTICASONE PROPIONATE 50 MCG/ACT NA SUSP: 2.0000 | Freq: Every day | NASAL | 2 refills | Status: AC

## 2021-01-16 NOTE — Discharge Instructions (Signed)
You were seen for left ear pain and are being treated for the same.   Use Flonase 2 times a day aiming towards your sinuses.  An over-the-counter allergy medication such as cetirizine (Zyrtec) or levocetirizine (Xyzal) can also help with drainage.  If your pain continues, make sure you follow-up with your primary care provider or ear nose throat doctor for further evaluation.  Follow-up with your primary care provider regarding your blood pressure.  Take care, Dr. Sharlet Salina, NP-c

## 2021-01-16 NOTE — ED Provider Notes (Signed)
[facility] [place] [facility] [place]   Name: Ann Huber DOB: 1991-08-20 MRN: [id number] CSN: [id number] PCP: [facility]  Arrival date and time:  01/16/21 0539  Chief Complaint:  Otalgia (left)   NOTE: Prior to seeing the patient today, I have reviewed the triage nursing documentation and vital signs. Clinical staff has updated patient's PMH/PSHx, current medication list, and drug allergies/intolerances to ensure comprehensive history available to assist in medical decision making.   History:   HPI: Ann Huber is a 30 y.o. female who presents today with complaints of pain to her left ear.  Patient states his pain started approximately 24 hours ago, but this pain has been on and off since November.  She received evaluation from urgent care providers and a primary care provider, and has been rotating between treatment of steroids, and steroids and antibiotics.  Patient states it started as a pressure yesterday and has increased to pain since this morning.  She denies any changes in her hearing, headaches, sore throat, fevers, or overall body aches.  She has yet to be evaluated by ENT.  No previous trauma to ears.     Past Medical History:  Diagnosis Date  . Hypertension     Past Surgical History:  Procedure Laterality Date  . CHOLECYSTECTOMY    . Tubes Tied      Family History  Problem Relation Age of Onset  . Hypertension Father     Social History   Tobacco Use  . Smoking status: Never Smoker  . Smokeless tobacco: Never Used  Vaping Use  . Vaping Use: Never used  Substance Use Topics  . Alcohol use: No  . Drug use: No    There are no problems to display for this patient.   Home Medications:    Current Meds  Medication Sig  . amLODipine (NORVASC) 5 MG tablet Take by mouth.  . fluticasone (FLONASE) 50 MCG/ACT nasal spray Place 2 sprays into both nostrils daily.  . predniSONE (STERAPRED UNI-PAK 21 TAB) 10 MG (21) TBPK tablet Take as  instructed on package (60, 50, 40, 30, 20, 10)    Allergies:   Latex  Review of Systems (ROS): Review of Systems  Constitutional: Negative for fatigue and fever.  HENT: Positive for congestion and ear pain. Negative for dental problem, ear discharge, postnasal drip, sinus pressure and sinus pain.      Vital Signs: Today's Vitals   01/16/21 0944 01/16/21 0946  BP:  (!) 158/94  Pulse:  (!) 105  Resp:  18  Temp:  98.6 F (37 C)  TempSrc:  Oral  SpO2:  100%  Weight: 261 lb 14.5 oz (118.8 kg)   Height: 5\' 5"  (1.651 m)   PainSc: 7      Physical Exam: Physical Exam Vitals and nursing note reviewed.  Constitutional:      Appearance: Normal appearance.  HENT:     Right Ear: Tympanic membrane normal.     Left Ear: No decreased hearing noted. No tenderness. A middle ear effusion is present. Tympanic membrane is not injected.     Ears:     Comments: Clear middle ear effusion Cardiovascular:     Rate and Rhythm: Normal rate and regular rhythm.     Pulses: Normal pulses.     Heart sounds: Normal heart sounds.  Pulmonary:     Effort: Pulmonary effort is normal.     Breath sounds: Normal breath sounds.  Skin:    General:  Skin is warm and dry.  Neurological:     General: No focal deficit present.     Mental Status: She is alert and oriented to person, place, and time.  Psychiatric:        Mood and Affect: Mood normal.        Behavior: Behavior normal.      Urgent Care Treatments / Results:   LABS: PLEASE NOTE: all labs that were ordered this encounter are listed, however only abnormal results are displayed. Labs Reviewed - No data to display  EKG: -None  RADIOLOGY: No results found.  PROCEDURES: Procedures  MEDICATIONS RECEIVED THIS VISIT: Medications - No data to display  PERTINENT CLINICAL COURSE NOTES/UPDATES:   Initial Impression / Assessment and Plan / Urgent Care Course:  Pertinent labs & imaging results that were available during my care of the  patient were personally reviewed by me and considered in my medical decision making (see lab/imaging section of note for values and interpretations).  [name] is a 30 y.o. female who presents to [facility] today with complaints of left ear pain, diagnosed with eustachian tube dysfunction, and treated as such with the medications below. NP and patient reviewed discharge instructions below during visit.   Patient is well appearing overall in clinic today. She does not appear to be in any acute distress. Presenting symptoms (see HPI) and exam as documented above.   I have reviewed the follow up and strict return precautions for any new or worsening symptoms. Patient is aware of symptoms that would be deemed urgent/emergent, and would thus require further evaluation either here or in the emergency department. At the time of discharge, she verbalized understanding and consent with the discharge plan as it was reviewed with her. All questions were fielded by provider and/or clinic staff prior to patient discharge.    Final Clinical Impressions / Urgent Care Diagnoses:   Final diagnoses:  Dysfunction of left eustachian tube    New Prescriptions:  [place] Controlled Substance Registry consulted? Not Applicable  Meds ordered this encounter  Medications  . fluticasone (FLONASE) 50 MCG/ACT nasal spray    Sig: Place 2 sprays into both nostrils daily.    Dispense:  18.2 mL    Refill:  2  . predniSONE (STERAPRED UNI-PAK 21 TAB) 10 MG (21) TBPK tablet    Sig: Take as instructed on package (60, 50, 40, 30, 20, 10)    Dispense:  1 each    Refill:  0      Discharge Instructions     You were seen for left ear pain and are being treated for the same.   Use Flonase 2 times a day aiming towards your sinuses.  An over-the-counter allergy medication such as cetirizine (Zyrtec) or levocetirizine (Xyzal) can also help with drainage.  If your pain continues, make sure you follow-up with your  primary care provider or ear nose throat doctor for further evaluation.  Follow-up with your primary care provider regarding your blood pressure.  Take care, Dr. Sharlet Salina, NP-c     Recommended Follow up Care:  Patient encouraged to follow up with the following provider within the specified time frame, or sooner as dictated by the severity of her symptoms. As always, she was instructed that for any urgent/emergent care needs, she should seek care either here or in the emergency department for more immediate evaluation.   Bailey Mech, DNP, NP-c   Bailey Mech, NP 01/16/21 1056

## 2021-01-16 NOTE — ED Triage Notes (Signed)
Pt c/o left ear pain. Started yesterday. Denies fever.

## 2021-12-29 DIAGNOSIS — E041 Nontoxic single thyroid nodule: Secondary | ICD-10-CM | POA: Diagnosis not present

## 2022-01-17 DIAGNOSIS — D440 Neoplasm of uncertain behavior of thyroid gland: Secondary | ICD-10-CM | POA: Diagnosis not present

## 2022-01-17 DIAGNOSIS — E041 Nontoxic single thyroid nodule: Secondary | ICD-10-CM | POA: Diagnosis not present

## 2022-01-17 DIAGNOSIS — E042 Nontoxic multinodular goiter: Secondary | ICD-10-CM | POA: Diagnosis not present

## 2022-02-08 DIAGNOSIS — Z808 Family history of malignant neoplasm of other organs or systems: Secondary | ICD-10-CM | POA: Diagnosis not present

## 2022-02-08 DIAGNOSIS — Z79899 Other long term (current) drug therapy: Secondary | ICD-10-CM | POA: Diagnosis not present

## 2022-02-08 DIAGNOSIS — E041 Nontoxic single thyroid nodule: Secondary | ICD-10-CM | POA: Diagnosis not present

## 2022-02-08 DIAGNOSIS — R1310 Dysphagia, unspecified: Secondary | ICD-10-CM | POA: Diagnosis not present

## 2022-02-08 DIAGNOSIS — Z800 Family history of malignant neoplasm of digestive organs: Secondary | ICD-10-CM | POA: Diagnosis not present

## 2022-02-08 DIAGNOSIS — E042 Nontoxic multinodular goiter: Secondary | ICD-10-CM | POA: Diagnosis not present

## 2022-03-01 DIAGNOSIS — Z6841 Body Mass Index (BMI) 40.0 and over, adult: Secondary | ICD-10-CM | POA: Diagnosis not present

## 2022-03-01 DIAGNOSIS — R635 Abnormal weight gain: Secondary | ICD-10-CM | POA: Diagnosis not present

## 2022-06-02 DIAGNOSIS — R7303 Prediabetes: Secondary | ICD-10-CM | POA: Diagnosis not present

## 2022-06-02 DIAGNOSIS — Z6841 Body Mass Index (BMI) 40.0 and over, adult: Secondary | ICD-10-CM | POA: Diagnosis not present

## 2022-06-27 DIAGNOSIS — R609 Edema, unspecified: Secondary | ICD-10-CM | POA: Diagnosis not present

## 2022-06-30 DIAGNOSIS — R609 Edema, unspecified: Secondary | ICD-10-CM | POA: Diagnosis not present

## 2022-06-30 DIAGNOSIS — G4733 Obstructive sleep apnea (adult) (pediatric): Secondary | ICD-10-CM | POA: Diagnosis not present

## 2022-06-30 DIAGNOSIS — R079 Chest pain, unspecified: Secondary | ICD-10-CM | POA: Diagnosis not present

## 2022-06-30 DIAGNOSIS — R002 Palpitations: Secondary | ICD-10-CM | POA: Diagnosis not present

## 2022-06-30 DIAGNOSIS — R0602 Shortness of breath: Secondary | ICD-10-CM | POA: Diagnosis not present

## 2022-06-30 DIAGNOSIS — I10 Essential (primary) hypertension: Secondary | ICD-10-CM | POA: Diagnosis not present

## 2022-07-08 DIAGNOSIS — R002 Palpitations: Secondary | ICD-10-CM | POA: Diagnosis not present

## 2022-07-20 DIAGNOSIS — R002 Palpitations: Secondary | ICD-10-CM | POA: Diagnosis not present

## 2022-07-29 DIAGNOSIS — B9689 Other specified bacterial agents as the cause of diseases classified elsewhere: Secondary | ICD-10-CM | POA: Diagnosis not present

## 2022-07-29 DIAGNOSIS — J019 Acute sinusitis, unspecified: Secondary | ICD-10-CM | POA: Diagnosis not present

## 2022-08-02 DIAGNOSIS — I10 Essential (primary) hypertension: Secondary | ICD-10-CM | POA: Diagnosis not present

## 2022-08-02 DIAGNOSIS — G4733 Obstructive sleep apnea (adult) (pediatric): Secondary | ICD-10-CM | POA: Diagnosis not present

## 2022-08-30 DIAGNOSIS — H9201 Otalgia, right ear: Secondary | ICD-10-CM | POA: Diagnosis not present

## 2022-08-30 DIAGNOSIS — J029 Acute pharyngitis, unspecified: Secondary | ICD-10-CM | POA: Diagnosis not present

## 2022-09-27 DIAGNOSIS — J019 Acute sinusitis, unspecified: Secondary | ICD-10-CM | POA: Diagnosis not present

## 2022-09-27 DIAGNOSIS — B9689 Other specified bacterial agents as the cause of diseases classified elsewhere: Secondary | ICD-10-CM | POA: Diagnosis not present

## 2022-10-07 DIAGNOSIS — R197 Diarrhea, unspecified: Secondary | ICD-10-CM | POA: Diagnosis not present

## 2022-10-07 DIAGNOSIS — R112 Nausea with vomiting, unspecified: Secondary | ICD-10-CM | POA: Diagnosis not present

## 2022-10-25 DIAGNOSIS — E669 Obesity, unspecified: Secondary | ICD-10-CM | POA: Diagnosis not present

## 2022-10-25 DIAGNOSIS — Z6841 Body Mass Index (BMI) 40.0 and over, adult: Secondary | ICD-10-CM | POA: Diagnosis not present

## 2022-10-25 DIAGNOSIS — Z713 Dietary counseling and surveillance: Secondary | ICD-10-CM | POA: Diagnosis not present

## 2022-10-25 DIAGNOSIS — I10 Essential (primary) hypertension: Secondary | ICD-10-CM | POA: Diagnosis not present

## 2023-08-01 ENCOUNTER — Other Ambulatory Visit: Payer: Self-pay | Admitting: Family Medicine

## 2023-08-01 DIAGNOSIS — R32 Unspecified urinary incontinence: Secondary | ICD-10-CM

## 2023-08-01 DIAGNOSIS — R4020 Unspecified coma: Secondary | ICD-10-CM

## 2023-08-01 DIAGNOSIS — R55 Syncope and collapse: Secondary | ICD-10-CM

## 2023-08-02 ENCOUNTER — Ambulatory Visit
Admission: RE | Admit: 2023-08-02 | Discharge: 2023-08-02 | Disposition: A | Discharge status: Home / Self Care | Payer: 59 | Source: Ambulatory Visit | Attending: Family Medicine | Admitting: Family Medicine

## 2023-08-02 DIAGNOSIS — R55 Syncope and collapse: Secondary | ICD-10-CM | POA: Insufficient documentation

## 2023-08-02 DIAGNOSIS — R4020 Unspecified coma: Secondary | ICD-10-CM | POA: Insufficient documentation

## 2023-08-02 DIAGNOSIS — R32 Unspecified urinary incontinence: Secondary | ICD-10-CM | POA: Diagnosis present

## 2024-07-31 ENCOUNTER — Ambulatory Visit (INDEPENDENT_AMBULATORY_CARE_PROVIDER_SITE_OTHER)

## 2024-07-31 ENCOUNTER — Ambulatory Visit
Admission: RE | Admit: 2024-07-31 | Discharge: 2024-07-31 | Disposition: A | Discharge status: Home / Self Care | Source: Ambulatory Visit | Attending: Family Medicine | Admitting: Family Medicine

## 2024-07-31 VITALS — BP 148/90 | HR 89 | Temp 98.7°F | Resp 16 | Wt 265.0 lb

## 2024-07-31 DIAGNOSIS — N898 Other specified noninflammatory disorders of vagina: Secondary | ICD-10-CM | POA: Diagnosis present

## 2024-07-31 DIAGNOSIS — R82998 Other abnormal findings in urine: Secondary | ICD-10-CM

## 2024-07-31 DIAGNOSIS — R108A2 Left flank tenderness: Secondary | ICD-10-CM | POA: Insufficient documentation

## 2024-07-31 DIAGNOSIS — R10A1 Flank pain, right side: Secondary | ICD-10-CM

## 2024-07-31 DIAGNOSIS — R300 Dysuria: Secondary | ICD-10-CM | POA: Diagnosis present

## 2024-07-31 LAB — URINALYSIS, W/ REFLEX TO CULTURE (INFECTION SUSPECTED)
Bilirubin Urine: NEGATIVE
Glucose, UA: NEGATIVE mg/dL
Ketones, ur: NEGATIVE mg/dL
Nitrite: NEGATIVE
Protein, ur: NEGATIVE mg/dL
Specific Gravity, Urine: >1.03 — ABNORMAL HIGH (ref 1.005–1.030)
pH: 5.5 (ref 5.0–8.0)

## 2024-07-31 MED ORDER — TAMSULOSIN HCL 0.4 MG PO CAPS: 0.4000 mg | ORAL_CAPSULE | Freq: Every day | ORAL | 0 refills | Status: AC | PRN

## 2024-07-31 NOTE — Discharge Instructions (Addendum)
 Your abdominal xray did not show any kidney stones.  I sent some Flomax to your pharmacy.  Be sure to drink plenty of fluids.   Your STD test results will be available in the next 72 hours. If positive, someone will contact you.  You should see your results in your MyChart account.   I sent your urine for culture to be sure you don't have a UTI. Someone may call you to stop/change antibiotics based off your culture results. Stop by the pharmacy to pick up your prescriptions.  Follow up with your primary care provider or return to the urgent care, if not improving.

## 2024-07-31 NOTE — ED Provider Notes (Signed)
 [facility]    CSN: [id number] Arrival date & time: 07/31/24  1625      History   Chief Complaint Chief Complaint  Patient presents with   Vaginal Discharge    Vaginal discomfort and discharge - Entered by patient   Dysuria     HPI HPI Ann Huber is a 33 y.o. female.    Ann Huber presents for vaginal irritation and dysuria for 3 weeks and started having vaginal discharge for the past 3 days.  Tried ACO prior to arrival.  Has not had any antibiotics in last 30 days.   Denies known STI exposure.  She is  not currently pregnant as she previously had a tubal ligation. Patient's last menstrual period was 07/21/2024.  She has history of kidney stones.    - Abnormal vaginal discharge: yes  - vaginal odor: no - vaginal bleeding: no - Dysuria: yes  - Hematuria: no - Urinary urgency: yes  - Urinary frequency: no  - Fever: no - Abdominal pain: no  - Pelvic pain: no - Rash/Skin lesions/mouth ulcers: no - Nausea: no  - Vomiting: no  - Back Pain: yes left lower       Past Medical History:  Diagnosis Date   Hypertension     There are no active problems to display for this patient.   Past Surgical History:  Procedure Laterality Date   CHOLECYSTECTOMY     Tubes Tied      OB History   No obstetric history on file.      Home Medications    Prior to Admission medications   Medication Sig Start Date End Date Taking? Authorizing Provider  amLODipine (NORVASC) 5 MG tablet Take by mouth. 01/06/21 07/31/24 Yes [provider]  tamsulosin (FLOMAX) 0.4 MG CAPS capsule Take 1 capsule (0.4 mg total) by mouth daily as needed (kidney stones). 07/31/24  Yes Lucelia Lacey, DO  fluticasone  (FLONASE ) 50 MCG/ACT nasal spray Place 2 sprays into both nostrils daily. 01/16/21   Benjamin, Lunise, NP  hydrochlorothiazide (HYDRODIURIL) 25 MG tablet Take 25 mg by mouth daily.    [provider]  ipratropium (ATROVENT ) 0.06 % nasal spray  Place 2 sprays into both nostrils 4 (four) times daily. 10/19/20   Arvis Jolan NOVAK, PA-C  predniSONE  (STERAPRED UNI-PAK 21 TAB) 10 MG (21) TBPK tablet Take as instructed on package (60, 50, 40, 30, 20, 10) 01/16/21   Morene Morrow, NP  buPROPion (WELLBUTRIN SR) 150 MG 12 hr tablet Take 150 mg by mouth daily. 11/24/16 09/06/20  [provider]  sertraline (ZOLOFT) 100 MG tablet Take 100 mg by mouth daily. 11/24/16 08/26/20  [provider]    Family History Family History  Problem Relation Age of Onset   Hypertension Father     Social History Social History   Tobacco Use   Smoking status: Never   Smokeless tobacco: Never  Vaping Use   Vaping status: Never Used  Substance Use Topics   Alcohol use: No   Drug use: No     Allergies   Olmesartan medoxomil-hctz and Latex   Review of Systems Review of Systems: :negative unless otherwise stated in HPI.      Physical Exam Triage Vital Signs ED Triage Vitals  Encounter Vitals Group     BP      Girls Systolic BP Percentile      Girls Diastolic BP Percentile      Boys Systolic BP Percentile  Boys Diastolic BP Percentile      Pulse      Resp      Temp      Temp src      SpO2      Weight      Height      Head Circumference      Peak Flow      Pain Score      Pain Loc      Pain Education      Exclude from Growth Chart    No data found.  Updated Vital Signs BP (!) 148/90 (BP Location: Right Arm)   Pulse 89   Temp 98.7 F (37.1 C) (Oral)   Resp 16   Wt 120.2 kg   LMP 07/21/2024   SpO2 98%   BMI 44.10 kg/m   Visual Acuity Right Eye Distance:   Left Eye Distance:   Bilateral Distance:    Right Eye Near:   Left Eye Near:    Bilateral Near:     Physical Exam GEN: well appearing female in no acute distress  CVS: well perfused  RESP: speaking in full sentences without pause  ABD: soft, non-tender, non-distended, no palpable masses, left CVA tenderness  SKIN: warm, dry    UC  Treatments / Results  Labs (all labs ordered are listed, but only abnormal results are displayed) Labs Reviewed  URINALYSIS, W/ REFLEX TO CULTURE (INFECTION SUSPECTED) - Abnormal; Notable for the following components:      Result Value   APPearance HAZY (*)    Specific Gravity, Urine >1.030 (*)    Hgb urine dipstick TRACE (*)    Leukocytes,Ua TRACE (*)    Bacteria, UA FEW (*)    All other components within normal limits  URINE CULTURE  CERVICOVAGINAL ANCILLARY ONLY    EKG   Radiology DG Abd 2 Views Result Date: 07/31/2024 EXAM: 2 VIEW XRAY OF THE ABDOMEN 07/31/2024 06:11:33 PM COMPARISON: None available. CLINICAL HISTORY: right flank pain, hx of kidney stones. Left flank pain per pt (not right) , hx of kidney stones ; Pt presents with vaginal irritation/discomfort and dysuria x 3 days. FINDINGS: BOWEL: Nonobstructive bowel gas pattern. SOFT TISSUES: No opaque urinary calculi. BONES: No acute osseous abnormality. SURGICAL FINDINGS: Status post cholecystectomy. IMPRESSION: 1. No acute abnormality identified. Electronically signed by: Lynwood Seip MD 07/31/2024 06:23 PM EDT RP Workstation: [id number]    Procedures Procedures (including critical care time)  Medications Ordered in UC Medications - No data to display  Initial Impression / Assessment and Plan / UC Course  I have reviewed the triage vital signs and the nursing notes.  Pertinent labs & imaging results that were available during my care of the patient were reviewed by me and considered in my medical decision making (see chart for details).      Patient is a 33 y.o.Ann Huber female  who presents for 3 weeks of dysuria and 3 days of vaginal discharge.  Overall patient is well-appearing and afebrile.  Vital signs stable.  Vaginal swab for yeast vaginitis, bacterial vaginitis, trichomonas, gonorrhea and chlamydia obtained.  Patient does not request prophylactic STI treatment. Declined HIV and RPR testing. HIV, RPR, GC/CT were  negative in Sept 2024.  Urinalysis not consistent with acute cystitis however does have a trace hematuria that was not supported on microscopy. She has trace leukocyte esterase with few bacteria but many WBCs.  Urine culture obtained.  Follow-up sensitivities and change antibiotics, if needed. Additionally, she had some calcium  oxalate crystals. Given history of kidney stones and left CVA tenderness on exam. Recommended KUB to and she is agreeable.  KUB obtained. Radiologist notes no acute findings.   Return precautions including abdominal pain, fever, chills, nausea, or vomiting given. Discussed MDM, treatment plan and plan for follow-up with patient who agrees with plan.       Final Clinical Impressions(s) / UC Diagnoses   Final diagnoses:  Dysuria  Calcium oxalate crystals in urine  Vaginal irritation  Abdominal tenderness in left flank     Discharge Instructions      Your abdominal xray did not show any kidney stones.  I sent some Flomax to your pharmacy.  Be sure to drink plenty of fluids.   Your STD test results will be available in the next 72 hours. If positive, someone will contact you.  You should see your results in your MyChart account.   I sent your urine for culture to be sure you don't have a UTI. Someone may call you to stop/change antibiotics based off your culture results. Stop by the pharmacy to pick up your prescriptions.  Follow up with your primary care provider or return to the urgent care, if not improving.         ED Prescriptions     Medication Sig Dispense Auth. Provider   tamsulosin (FLOMAX) 0.4 MG CAPS capsule Take 1 capsule (0.4 mg total) by mouth daily as needed (kidney stones). 20 capsule Tahja Liao, DO      PDMP not reviewed this encounter.   Dvonte Gatliff, DO 07/31/24 1847

## 2024-07-31 NOTE — ED Triage Notes (Signed)
 Pt presents with vaginal irritation/discomfort and dysuria x 3 days.

## 2024-08-01 ENCOUNTER — Ambulatory Visit (HOSPITAL_COMMUNITY): Payer: Self-pay

## 2024-08-01 LAB — URINE CULTURE: Culture: <10000 — AB

## 2024-08-01 MED ORDER — METRONIDAZOLE 500 MG PO TABS: 500.0000 mg | ORAL_TABLET | Freq: Two times a day (BID) | ORAL | 0 refills | Status: AC

## 2024-08-02 LAB — CERVICOVAGINAL ANCILLARY ONLY
Bacterial Vaginitis (gardnerella): POSITIVE — AB
Candida Glabrata: NEGATIVE
Candida Vaginitis: NEGATIVE
Chlamydia: NEGATIVE
Comment: NEGATIVE
Comment: NEGATIVE
Comment: NEGATIVE
Comment: NEGATIVE
Comment: NEGATIVE
Comment: NORMAL
Neisseria Gonorrhea: NEGATIVE
Trichomonas: POSITIVE — AB
